# Patient Record
Sex: Female | Born: 1960 | Race: White | Hispanic: No | Marital: Married | Smoking: Former smoker
Health system: Southern US, Community
[De-identification: ages and names within clinical notes are randomized; demographics above are authoritative.]

## PROBLEM LIST (undated history)

## (undated) DIAGNOSIS — U071 COVID-19: Secondary | ICD-10-CM

## (undated) DIAGNOSIS — R4020 Unspecified coma: Secondary | ICD-10-CM

## (undated) HISTORY — DX: COVID-19: U07.1

## (undated) HISTORY — DX: Unspecified coma: R40.20

---

## 1999-09-17 ENCOUNTER — Other Ambulatory Visit: Admission: RE | Admit: 1999-09-17 | Discharge: 1999-09-17 | Payer: Self-pay | Admitting: Obstetrics and Gynecology

## 2001-06-11 ENCOUNTER — Other Ambulatory Visit: Admission: RE | Admit: 2001-06-11 | Discharge: 2001-06-11 | Payer: Self-pay | Admitting: Obstetrics and Gynecology

## 2001-08-13 ENCOUNTER — Other Ambulatory Visit: Admission: RE | Admit: 2001-08-13 | Discharge: 2001-08-13 | Payer: Self-pay | Admitting: Obstetrics and Gynecology

## 2003-12-27 ENCOUNTER — Emergency Department (HOSPITAL_COMMUNITY): Admission: EM | Admit: 2003-12-27 | Discharge: 2003-12-28 | Payer: Self-pay | Admitting: *Deleted

## 2003-12-29 ENCOUNTER — Inpatient Hospital Stay (HOSPITAL_COMMUNITY): Admission: EM | Admit: 2003-12-29 | Discharge: 2004-02-18 | Payer: Self-pay | Admitting: Emergency Medicine

## 2004-01-06 ENCOUNTER — Encounter (INDEPENDENT_AMBULATORY_CARE_PROVIDER_SITE_OTHER): Payer: Self-pay | Admitting: Specialist

## 2004-02-18 ENCOUNTER — Inpatient Hospital Stay (HOSPITAL_COMMUNITY): Admission: RE | Admit: 2004-02-18 | Discharge: 2004-02-24 | Payer: Self-pay | Admitting: Emergency Medicine

## 2004-03-04 ENCOUNTER — Encounter
Admission: RE | Admit: 2004-03-04 | Discharge: 2004-04-05 | Payer: Self-pay | Admitting: Physical Medicine & Rehabilitation

## 2004-03-09 ENCOUNTER — Ambulatory Visit (HOSPITAL_COMMUNITY)
Admission: RE | Admit: 2004-03-09 | Discharge: 2004-03-09 | Payer: Self-pay | Admitting: Physical Medicine & Rehabilitation

## 2004-03-31 ENCOUNTER — Encounter: Admission: RE | Admit: 2004-03-31 | Discharge: 2004-03-31 | Payer: Self-pay | Admitting: Thoracic Surgery

## 2004-04-02 ENCOUNTER — Encounter
Admission: RE | Admit: 2004-04-02 | Discharge: 2004-07-01 | Payer: Self-pay | Admitting: Physical Medicine & Rehabilitation

## 2004-05-25 ENCOUNTER — Ambulatory Visit: Payer: Self-pay | Admitting: Psychology

## 2004-06-08 ENCOUNTER — Ambulatory Visit: Payer: Self-pay | Admitting: Psychology

## 2004-06-29 ENCOUNTER — Encounter: Admission: RE | Admit: 2004-06-29 | Discharge: 2004-09-27 | Payer: Self-pay | Admitting: Psychology

## 2004-07-14 ENCOUNTER — Ambulatory Visit: Payer: Self-pay | Admitting: Psychology

## 2004-08-04 ENCOUNTER — Ambulatory Visit: Payer: Self-pay | Admitting: Psychology

## 2004-09-03 ENCOUNTER — Inpatient Hospital Stay (HOSPITAL_COMMUNITY): Admission: EM | Admit: 2004-09-03 | Discharge: 2004-09-09 | Payer: Self-pay | Admitting: *Deleted

## 2004-09-17 ENCOUNTER — Ambulatory Visit: Payer: Self-pay | Admitting: Psychology

## 2004-10-28 ENCOUNTER — Encounter: Admission: RE | Admit: 2004-10-28 | Discharge: 2005-01-26 | Payer: Self-pay

## 2004-12-18 ENCOUNTER — Ambulatory Visit: Payer: Self-pay | Admitting: Critical Care Medicine

## 2004-12-18 ENCOUNTER — Inpatient Hospital Stay (HOSPITAL_COMMUNITY): Admission: EM | Admit: 2004-12-18 | Discharge: 2004-12-20 | Payer: Self-pay | Admitting: Emergency Medicine

## 2004-12-23 ENCOUNTER — Ambulatory Visit: Payer: Self-pay | Admitting: Internal Medicine

## 2005-01-06 ENCOUNTER — Ambulatory Visit: Payer: Self-pay | Admitting: Internal Medicine

## 2005-02-13 IMAGING — CR DG CHEST 1V PORT
1 series · 2 of 2 positions shown · non-contrast
Comparison: none

CLINICAL DATA: Pneumonitis.
 CHEST PORTABLE, ONE VIEW 01/15/04 AT 3233 HOURS

[Series 1001: view not recorded · 0.20mm/px · 2 of 2 slices shown]
[im 1/2]
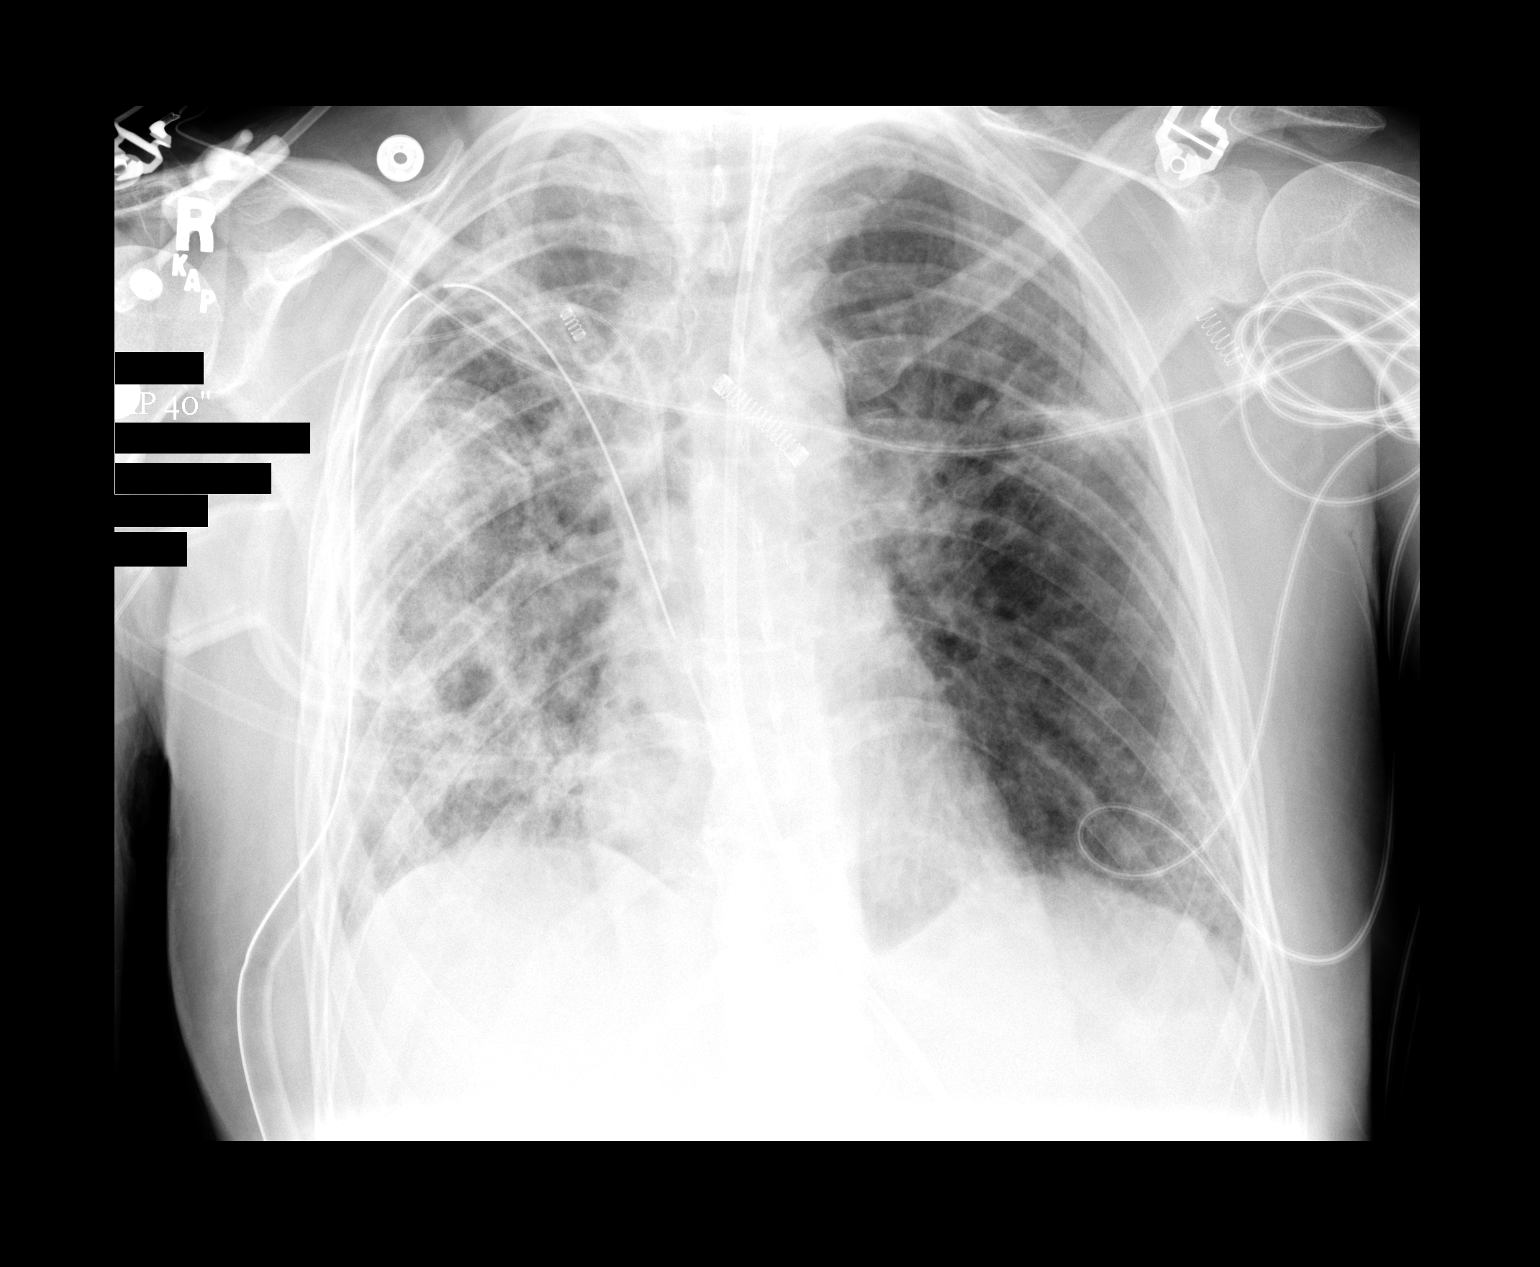
[im 2/2]
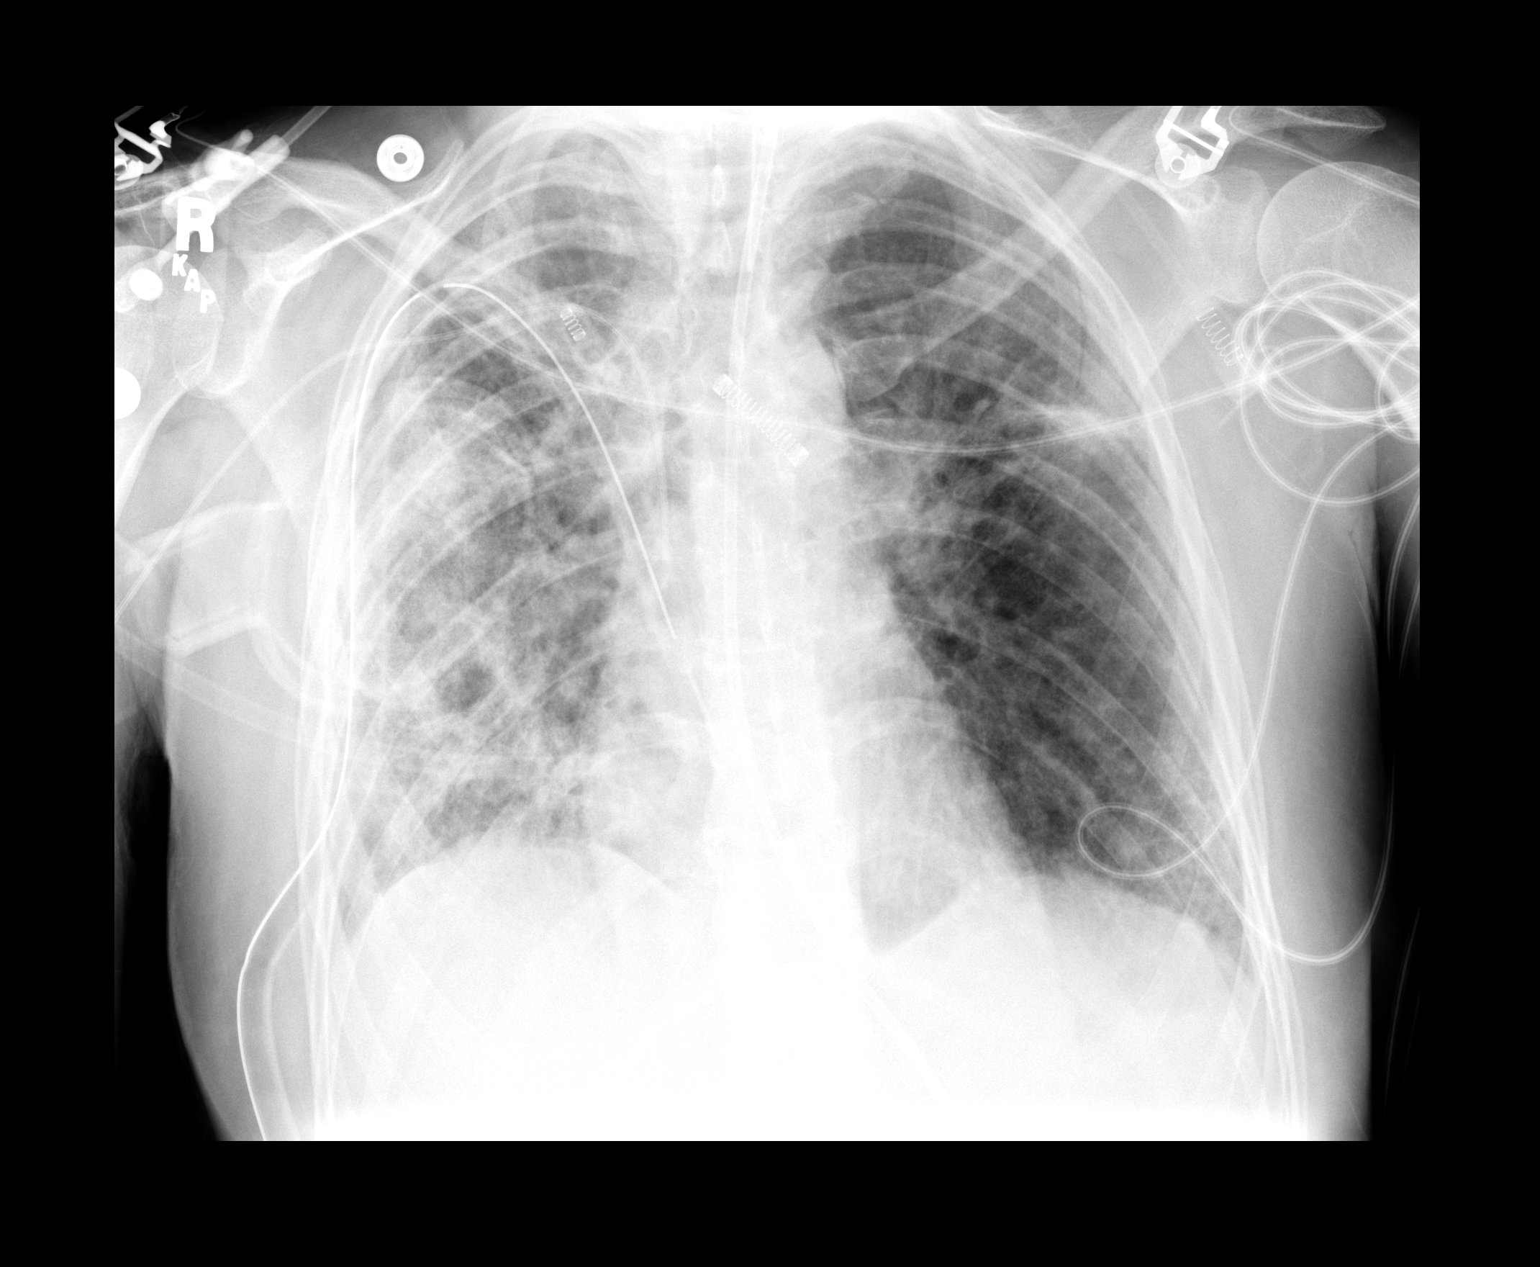

[2 of 2 positions shown; findings below may reference images not displayed]

FINDINGS: The endotracheal tube, feeding tube, and right chest tube are stable.  The right upper extremity PICC has been advanced to the SVC/right atrial junction.  Significant patchy bilateral airspace disease, right greater than left, is unchanged.  A tiny right apical pneumothorax is present.  No left pneumothorax is seen.  The heart remains normal in size. 
 IMPRESSION
 1.  Stable bilateral airspace disease.
 2.  Tiny right apical pneumothorax.

## 2005-02-18 IMAGING — CR DG CHEST 1V PORT
1 series · 1 of 1 positions shown · non-contrast
Comparison: 01/19/04.

CLINICAL DATA: followup airspace disease
 PORTABLE CHEST 01/20/04 [DATE] hours:

[view not recorded]
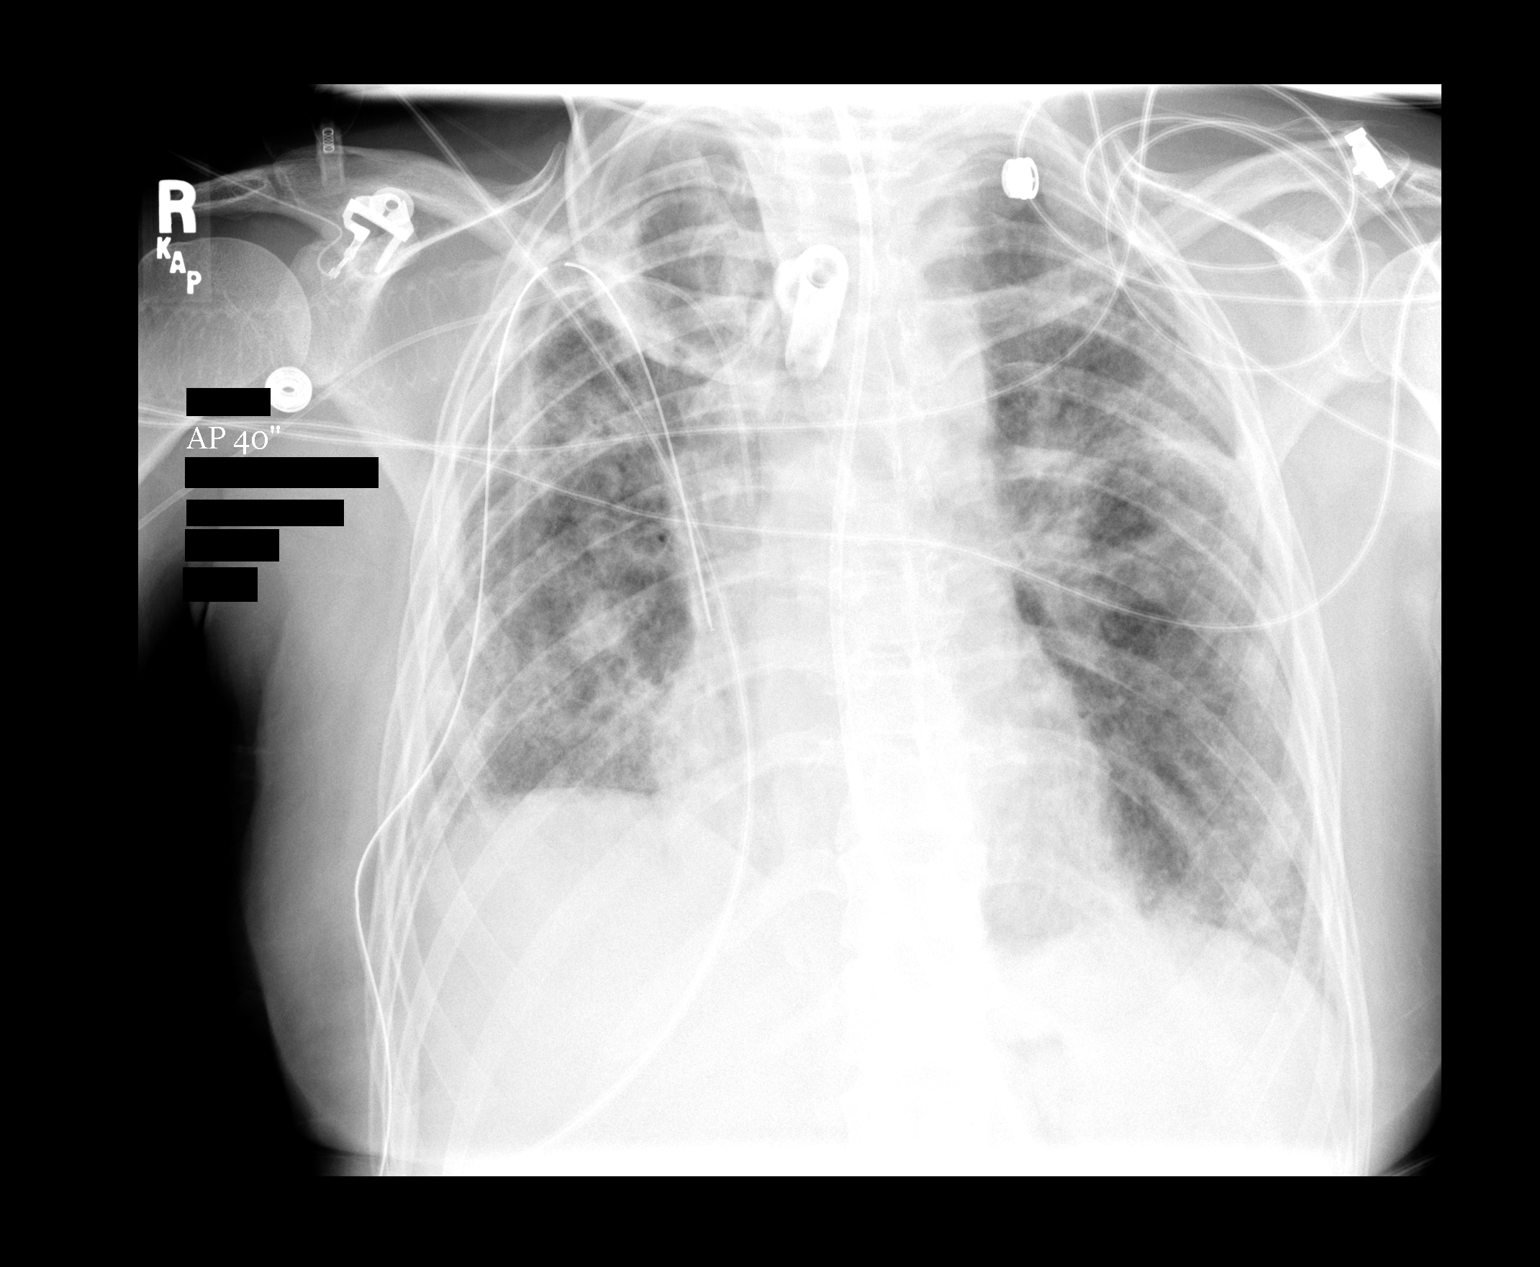

[1 of 1 positions shown; findings below may reference images not displayed]

Tracheostomy tube tip located 2cm above the carina.  There is a small right apical pneumothorax which is unchanged.  Bilateral airspace disease with mild worsening aeration left lower lobe.  No other significant change.  
 IMPRESSION
 Small right apical pneumothorax- stable.  Bilateral airspace disease with mild worsening aeration of the left lung base in the interval.

## 2005-02-19 IMAGING — CR DG CHEST 1V PORT
1 series · 1 of 1 positions shown · non-contrast
Comparison: none

CLINICAL DATA: Pneumonitis .
 PORTABLE CHEST RADIOGRAPH 
 Comparing 01/20/04.

[view not recorded]
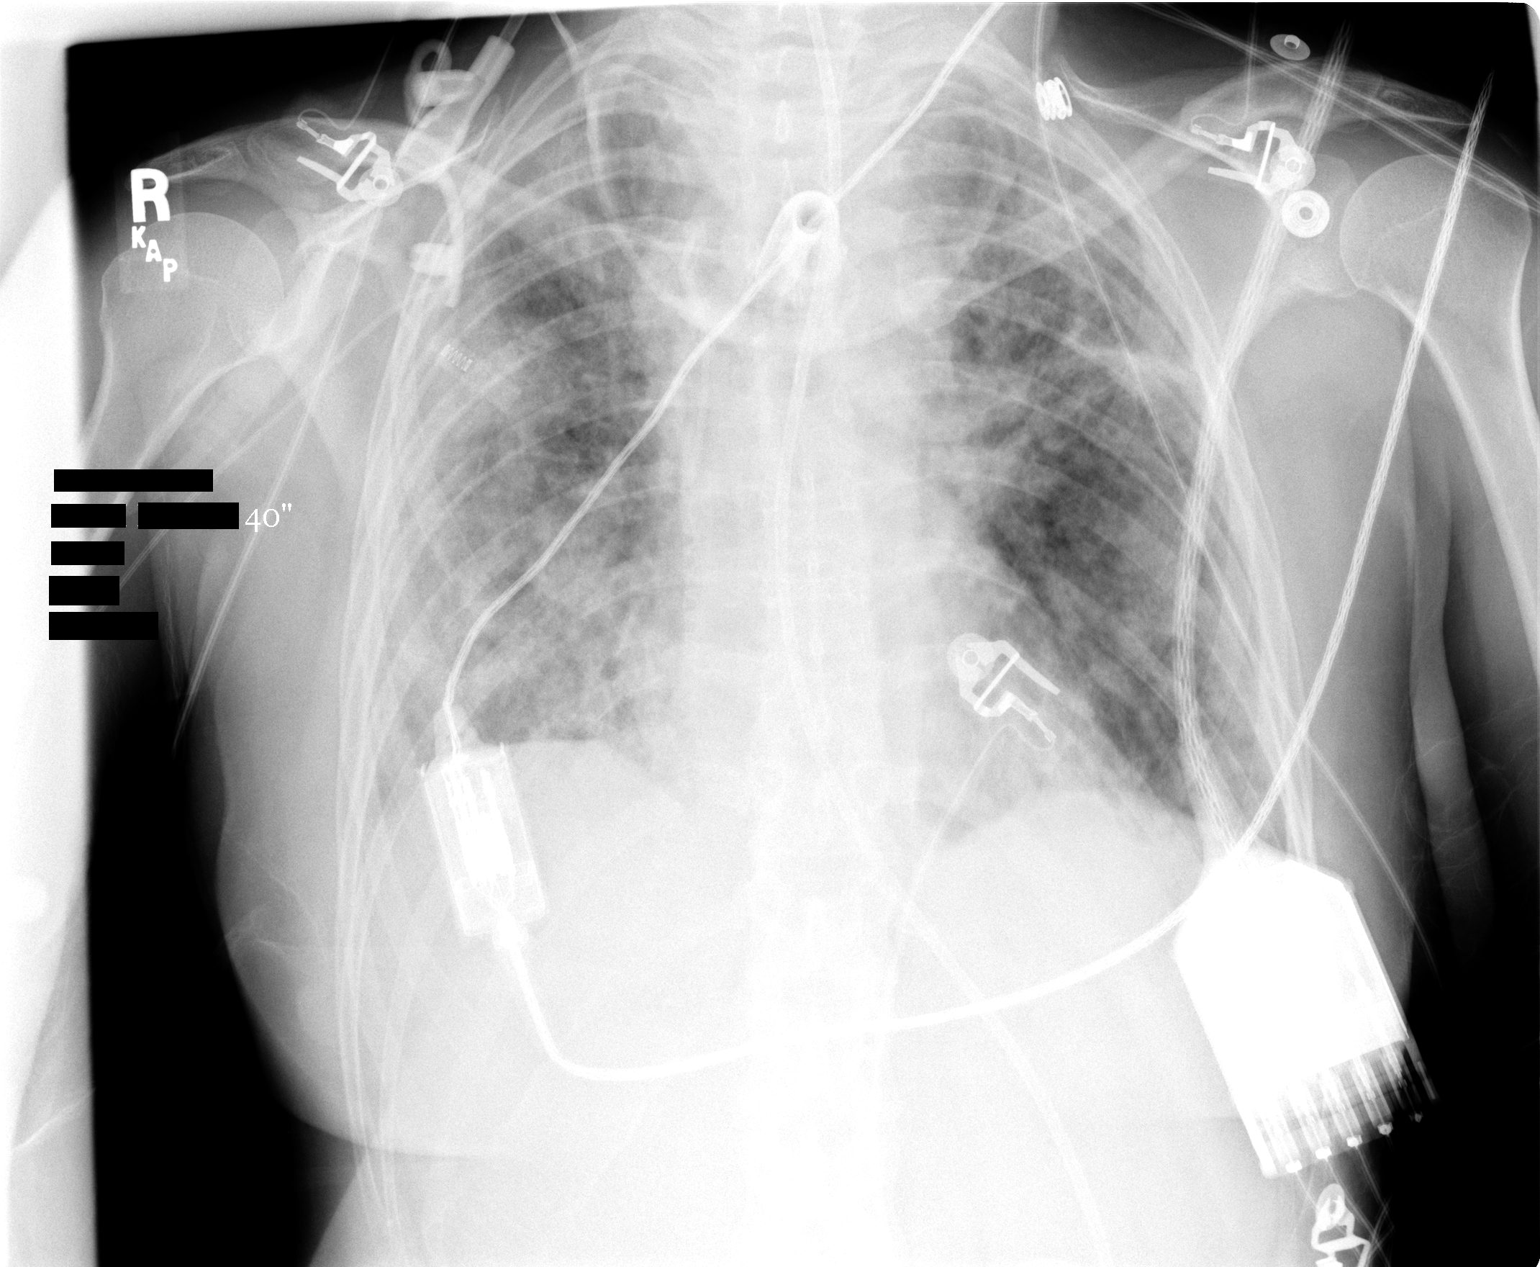

[1 of 1 positions shown; findings below may reference images not displayed]

FINDINGS: Bilateral patchy interstitial and air space opacities are again noted.  Support apparatus appears stable.  The patient has a known small right-sided paramediastinal pneumothorax which is likely still present although difficult to perceive on today?s exam.  
 IMPRESSION
 1.  Essentially stable pattern of patchy air space opacities.  The right pneumothorax is not as readily apparent but is likely still present.

## 2005-02-20 IMAGING — CR DG CHEST 1V PORT
1 series · 1 of 1 positions shown · non-contrast
Comparison: 01/21/04.

CLINICAL DATA: Follow-up airspace disease. 
 PORTABLE CHEST 01/22/04 TAKEN AT 6484 HOURS

[view not recorded]
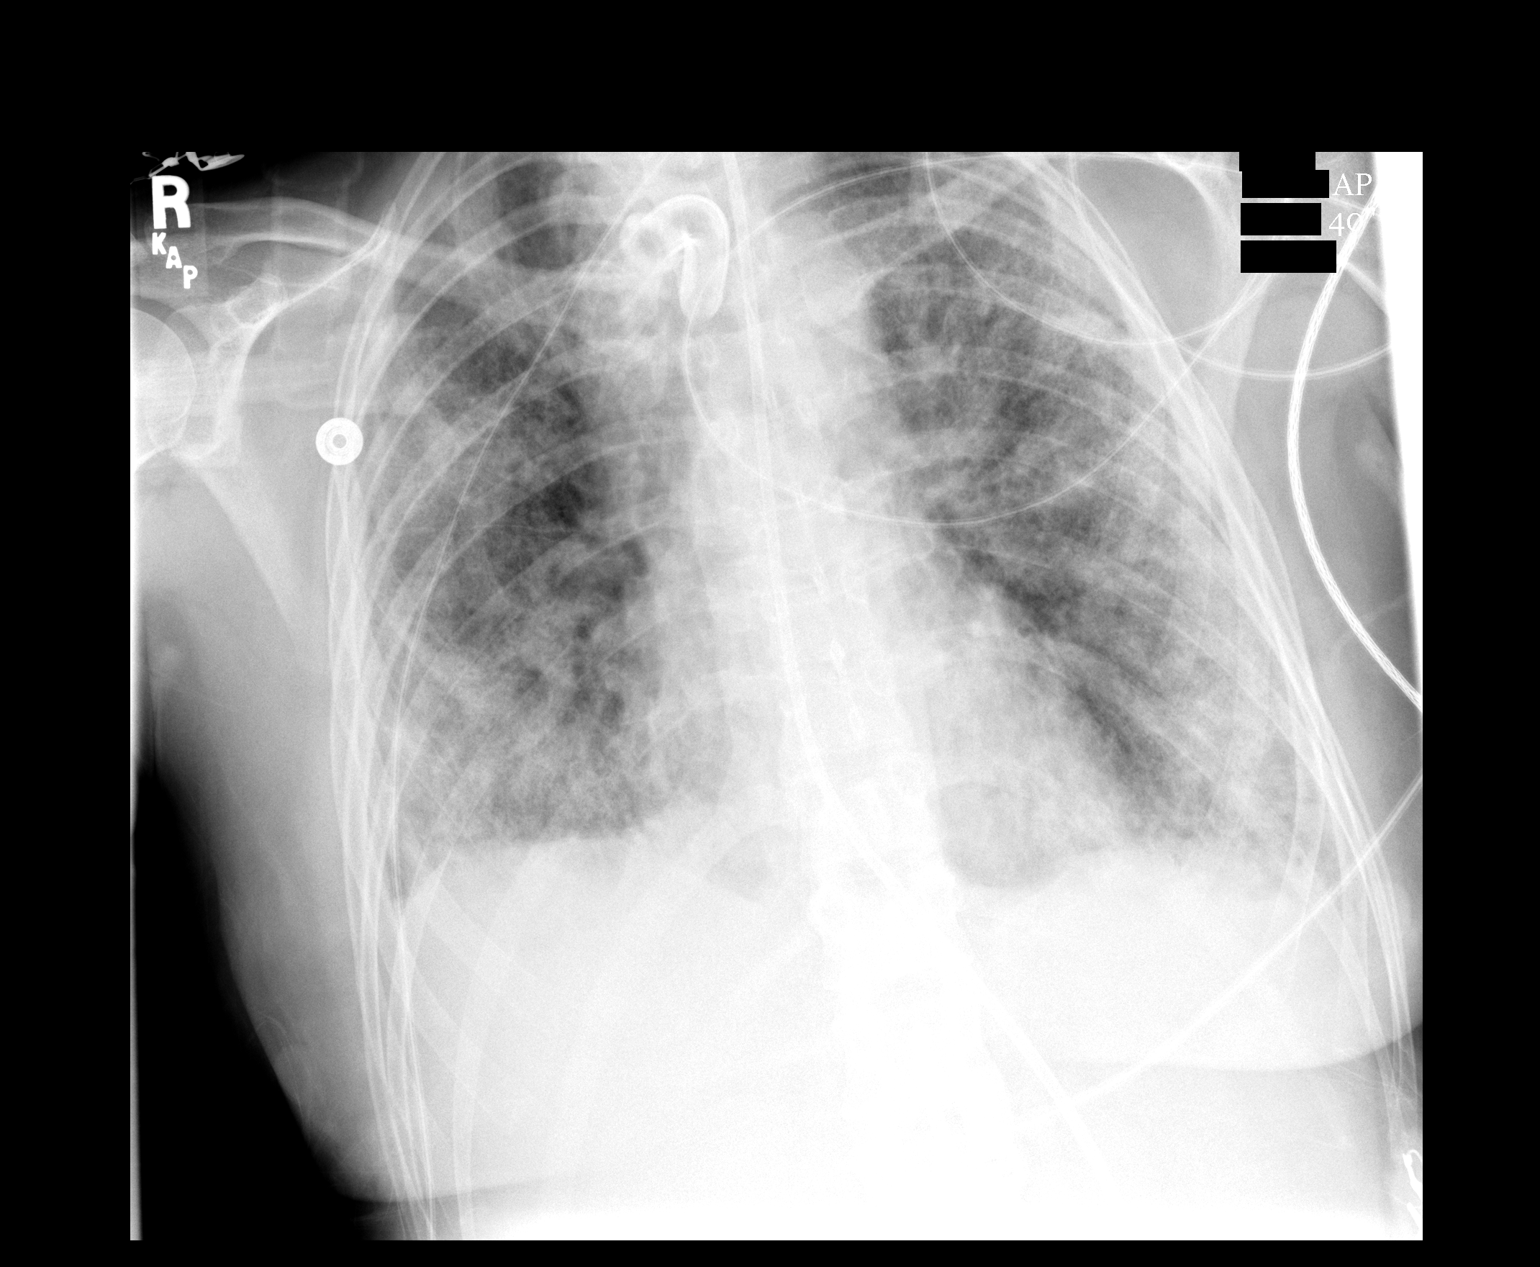

[1 of 1 positions shown; findings below may reference images not displayed]

There is diffuse bilateral airspace disease, which is stable.  There is no pneumothorax.  Tracheostomy tube appears in satisfactory position.  The right sided PICC line has been removed. 
 IMPRESSION
 No significant change in diffuse bilateral airspace disease.

## 2005-02-20 IMAGING — CR DG CHEST 1V PORT
1 series · 1 of 1 positions shown · non-contrast
Comparison: 01/22/04 earlier study.

CLINICAL DATA: Pneumonitis.  Central line placement.  
 PORTABLE CHEST 01/22/04 TAKEN AT 8855 HOURS

[view not recorded]
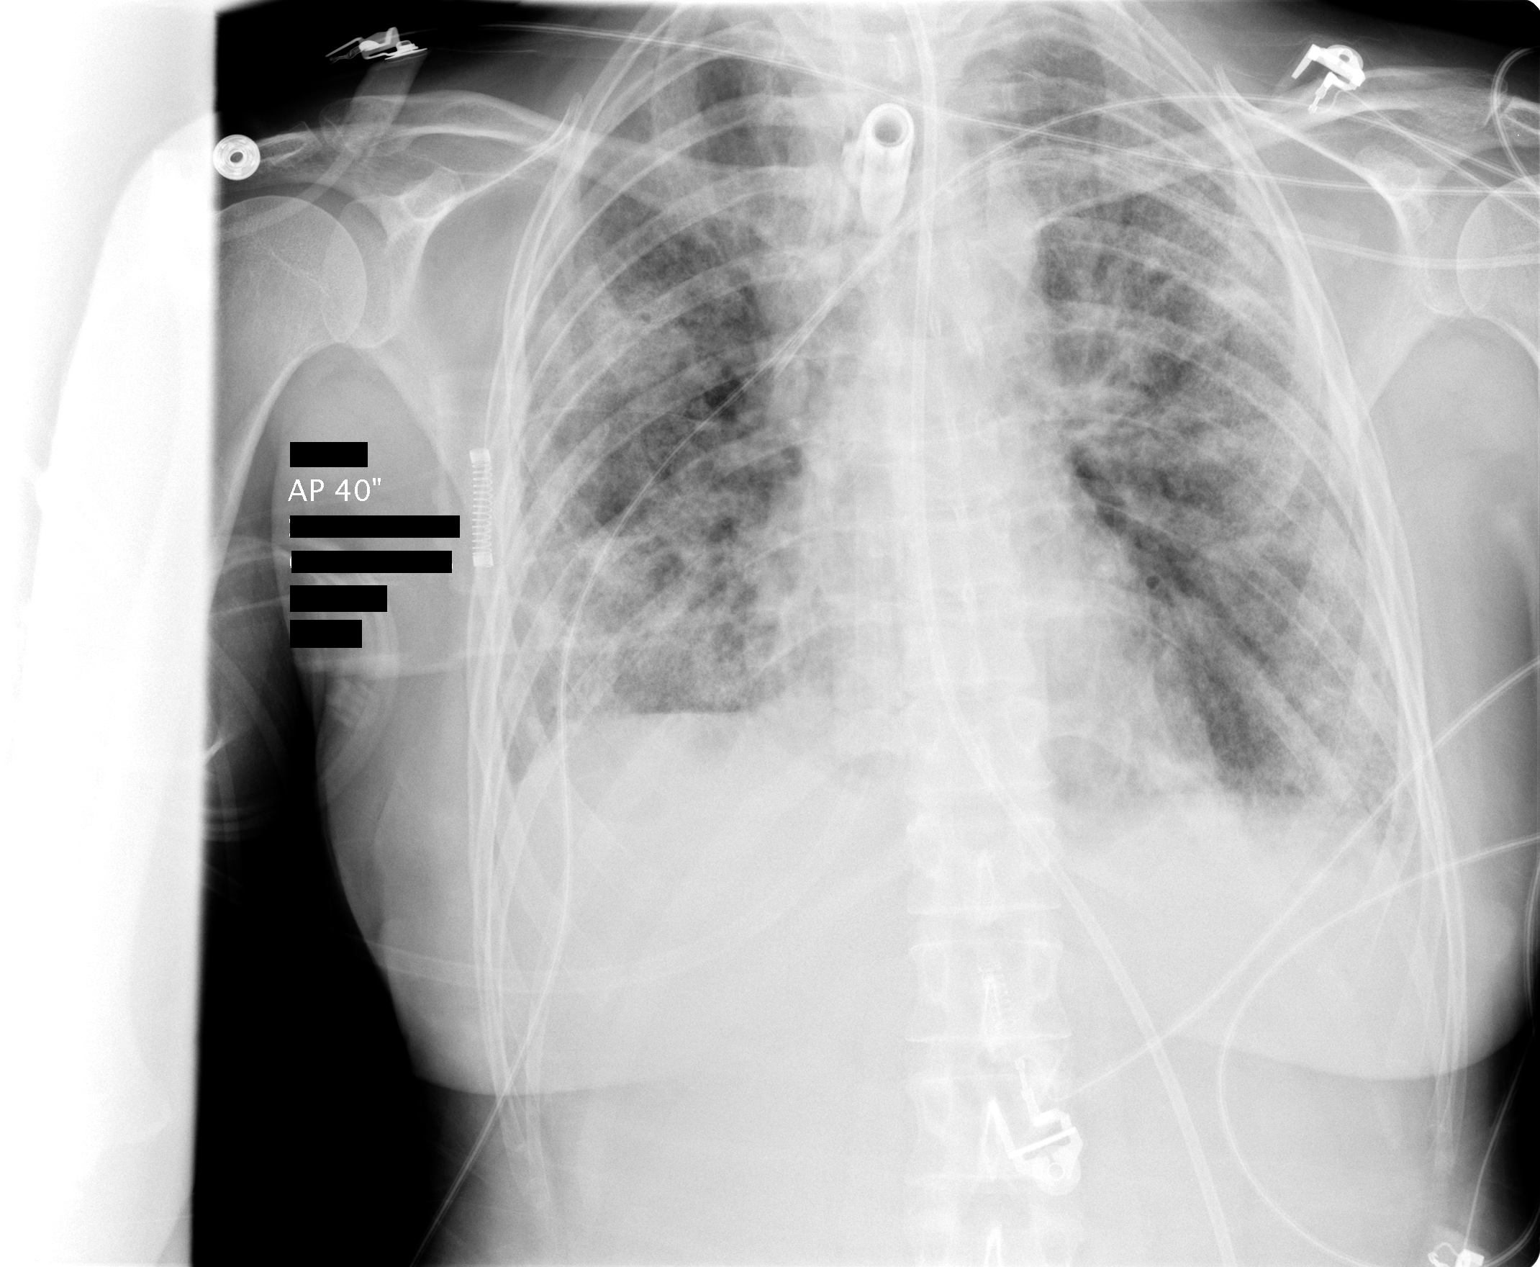

[1 of 1 positions shown; findings below may reference images not displayed]

Central venous catheter has been placed via a left subclavian route with the tip of the catheter in the region of the superior vena cava.  There is no pneumothorax.  There has been no significant change in the bilateral airspace disease. 
 IMPRESSION
 Placement of central venous catheter via a left subclavian route with the tip of the catheter in the region of the superior vena cava.  No pneumothorax.  No significant in bilateral airspace disease.

## 2005-10-04 ENCOUNTER — Encounter: Admission: RE | Admit: 2005-10-04 | Discharge: 2005-10-04 | Payer: Self-pay | Admitting: Obstetrics and Gynecology

## 2005-10-13 ENCOUNTER — Other Ambulatory Visit: Admission: RE | Admit: 2005-10-13 | Discharge: 2005-10-13 | Payer: Self-pay | Admitting: Obstetrics and Gynecology

## 2005-10-15 ENCOUNTER — Ambulatory Visit: Payer: Self-pay | Admitting: Internal Medicine

## 2005-10-15 ENCOUNTER — Inpatient Hospital Stay (HOSPITAL_COMMUNITY): Admission: EM | Admit: 2005-10-15 | Discharge: 2005-10-22 | Payer: Self-pay | Admitting: Emergency Medicine

## 2005-11-04 ENCOUNTER — Encounter: Admission: RE | Admit: 2005-11-04 | Discharge: 2006-02-02 | Payer: Self-pay | Admitting: Psychology

## 2005-11-04 ENCOUNTER — Ambulatory Visit: Payer: Self-pay | Admitting: Psychology

## 2005-11-09 ENCOUNTER — Other Ambulatory Visit (HOSPITAL_COMMUNITY): Admission: RE | Admit: 2005-11-09 | Discharge: 2005-11-29 | Payer: Self-pay | Admitting: Psychiatry

## 2005-11-09 ENCOUNTER — Ambulatory Visit: Payer: Self-pay | Admitting: Psychiatry

## 2005-12-08 ENCOUNTER — Ambulatory Visit: Payer: Self-pay | Admitting: Pulmonary Disease

## 2006-01-18 ENCOUNTER — Ambulatory Visit: Payer: Self-pay | Admitting: Psychology

## 2006-11-15 ENCOUNTER — Emergency Department (HOSPITAL_COMMUNITY): Admission: EM | Admit: 2006-11-15 | Discharge: 2006-11-16 | Payer: Self-pay | Admitting: Emergency Medicine

## 2007-06-14 ENCOUNTER — Emergency Department (HOSPITAL_COMMUNITY): Admission: EM | Admit: 2007-06-14 | Discharge: 2007-06-14 | Payer: Self-pay | Admitting: Emergency Medicine

## 2007-09-26 ENCOUNTER — Ambulatory Visit: Payer: Self-pay | Admitting: Psychology

## 2007-09-26 ENCOUNTER — Encounter: Admission: RE | Admit: 2007-09-26 | Discharge: 2007-12-25 | Payer: Self-pay | Admitting: Family Medicine

## 2007-11-15 ENCOUNTER — Ambulatory Visit: Payer: Self-pay | Admitting: Psychology

## 2009-09-06 ENCOUNTER — Encounter: Payer: Self-pay | Admitting: Internal Medicine

## 2009-09-10 ENCOUNTER — Encounter: Payer: Self-pay | Admitting: Internal Medicine

## 2009-09-14 ENCOUNTER — Encounter: Payer: Self-pay | Admitting: Internal Medicine

## 2009-09-15 ENCOUNTER — Encounter: Payer: Self-pay | Admitting: Internal Medicine

## 2009-09-16 ENCOUNTER — Ambulatory Visit: Payer: Self-pay | Admitting: Internal Medicine

## 2009-09-16 DIAGNOSIS — R05 Cough: Secondary | ICD-10-CM

## 2009-09-16 DIAGNOSIS — J449 Chronic obstructive pulmonary disease, unspecified: Secondary | ICD-10-CM

## 2009-09-16 DIAGNOSIS — J209 Acute bronchitis, unspecified: Secondary | ICD-10-CM | POA: Insufficient documentation

## 2009-09-16 DIAGNOSIS — R059 Cough, unspecified: Secondary | ICD-10-CM | POA: Insufficient documentation

## 2009-09-16 DIAGNOSIS — J984 Other disorders of lung: Secondary | ICD-10-CM | POA: Insufficient documentation

## 2009-10-19 ENCOUNTER — Ambulatory Visit: Payer: Self-pay | Admitting: Internal Medicine

## 2009-10-19 DIAGNOSIS — F17200 Nicotine dependence, unspecified, uncomplicated: Secondary | ICD-10-CM | POA: Insufficient documentation

## 2009-10-22 ENCOUNTER — Telehealth (INDEPENDENT_AMBULATORY_CARE_PROVIDER_SITE_OTHER): Payer: Self-pay | Admitting: *Deleted

## 2010-06-29 ENCOUNTER — Telehealth (INDEPENDENT_AMBULATORY_CARE_PROVIDER_SITE_OTHER): Payer: Self-pay | Admitting: *Deleted

## 2010-09-19 ENCOUNTER — Encounter: Payer: Self-pay | Admitting: Thoracic Surgery

## 2010-09-19 ENCOUNTER — Encounter: Payer: Self-pay | Admitting: Physical Medicine & Rehabilitation

## 2010-09-28 NOTE — Letter (Signed)
Summary: [facility] [employer]   Imported By: Lennie Odor 09/22/2009 14:47:50  _____________________________________________________________________  External Attachment:    Type:   Image     Comment:   External Document

## 2010-09-28 NOTE — Progress Notes (Signed)
  Phone Note Other Incoming   Request: Send information Summary of Call: Request for records received from [facility]. Request forwarded to [facility].     

## 2010-09-28 NOTE — Miscellaneous (Signed)
Summary: Orders Update pft charges  Clinical Lists Changes  Orders: Added new Service order of Carbon Monoxide diffusing w/capacity (94720) - Signed Added new Service order of Lung Volumes (94240) - Signed Added new Service order of Spirometry (Pre & Post) (94060) - Signed 

## 2010-09-28 NOTE — Letter (Signed)
Summary: [facility] [employer]   Imported By: Lennie Odor 09/22/2009 14:34:16  _____________________________________________________________________  External Attachment:    Type:   Image     Comment:   External Document

## 2010-09-28 NOTE — Progress Notes (Signed)
Summary: refill tramadol  Phone Note Call from Patient   Caller: Spouse Call For: wert Summary of Call: per spouse: pt coughing agreat deal and requests "the pills she had before that helped control her cough". (pt was seen here 2/21). spouse clay cell # [phone] Initial call taken by: Tivis Ringer, CNA,  October 22, 2009 3:27 PM  Follow-up for Phone Call        called and spoke with pts husband clay---he stated that this all started this am---dry cough---denies fever but does have body aches.  she is currently on amoxicillian for sinus infection and ear infection--she is requesting to have tramadol called in for the severe cough.  please advise.  thanks Randell Loop CMA  October 22, 2009 3:42 PM  ok x # 40 no refills Follow-up by: Nyoka Cowden MD,  October 22, 2009 5:21 PM  Additional Follow-up for Phone Call Additional follow up Details #1::        Spoke with pt's spouse and made aware rx being called in.   Additional Follow-up by: Vernie Murders,  October 22, 2009 5:30 PM    New/Updated Medications: TRAMADOL HCL 50 MG TABS (TRAMADOL HCL) 1 to 2 every 4-6 hours as needed Prescriptions: TRAMADOL HCL 50 MG TABS (TRAMADOL HCL) 1 to 2 every 4-6 hours as needed  #40 x 0   Entered by:   Vernie Murders   Authorized by:   Nyoka Cowden MD   Signed by:   Vernie Murders on 10/22/2009   Method used:   Electronically to        [facility] [phone]* (retail)       [address]       [place], [place]  [address]       Ph: [phone] or [phone]       Fax: [phone]   RxID:   [id number]

## 2010-09-28 NOTE — Assessment & Plan Note (Signed)
Summary: Pulmonary/ ext summary f/u ov   Copy to:  [name], FNP Primary Provider/Referring Provider:  none  CC:  1 month followup.  Pt states that cough and breathing are much better.  No complaints today.Ashley [name]  History of Present Illness: 61 yowf smoker 2005 ARDS recovered except not able to do aerobics due to doe chronically.   September 16, 2009 cc worse x 2week  sob  chest tightness and hoarse and cough > clear mucus worse day than night assoc with nasal congestion and sore throat .  not much better after rx with levaquin/sterapred and atrovent 1/13.  rec stop smoking take omeprazole and pepcid at bedtime  October 19, 2009 still smoking but  cough and breathing are much better.  No complaints today.  Pt denies any significant sore throat, dysphagia, itching, sneezing,  nasal congestion or excess secretions,  fever, chills, sweats, unintended wt loss, pleuritic or exertional cp, hempoptysis, change in activity tolerance  orthopnea pnd or leg swelling Pt denies any significant sore throat, dysphagia, itching, sneezing,  nasal congestion or excess secretions,  fever, chills, sweats, unintended wt loss, pleuritic or exertional cp, hempoptysis, change in activity tolerance  orthopnea pnd or leg swelling     Current Medications (verified): 1)  Atrovent Hfa 17 Mcg/act Aers (Ipratropium Bromide Hfa) .... 2 Puffs Once Daily 2)  Alprazolam 0.5 Mg Tabs (Alprazolam) .Ashley [name].. 1 Three Times A Day As Needed 3)  Tramadol Hcl 50 Mg  Tabs (Tramadol Hcl) .... One To Two By Mouth Every 4-6 Hours As Needed For Cough 4)  Cymbalta 60 Mg Cpep (Duloxetine Hcl) .Ashley [name].. 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  Oropharynx no thrush or excess pnd or cobblestoning.  No JVD or cervical adenopathy. Mild accessory muscle hypertrophy. Trachea midline, nl thryroid. Chest was hyperinflated by percussion with diminished breath sounds and moderate increased exp time without wheeze. Hoover sign positive at mid inspiration. Regular rate and rhythm without murmur gallop or rub or increase P2 or edema.  Abd: no hsm, nl excursion. Ext warm without cyanosis or clubbing.     CXR  Procedure date:  10/19/2009  Findings:      Chronic diffuse reticulonodular interstitial lung disease, less severe than on examinations in 2008 and 2006.  No new/acute cardiopulmonary disease.    Impression & Recommendations:  Problem # 1:  CHRONIC OBSTRUCTIVE PULMONARY DISEASE (ICD-496) Excluded by PFT's today   I took this opportunity to educate the patient regarding the consequences of smoking in airway disorders based on all the data we have from the multiple national lung health studies indicating that smoking  cessation, not choice of inhalers or physicians, is the most important aspect of care.   therefore focus should be on stopping smoking  Problem # 2:  COUGH (ICD-786.2) Upper airway cough syndrome, so named because it's frequently impossible to sort out how much is LPR vs CR/sinusitis with freq throat clearing generating secondary extra esophageal GERD from wide swings in gastric pressure that occur with throat clearing, promoting self use of mint and menthol lozenges that reduce the lower esophageal sphincter tone and exacerbate the problem  further.  These symptoms are easily confused with asthma/copd by even experienced pulmonogists because they overlap so much. These are the same pts who not infrequently have failed to tolerate ace inhibitors,  dry powder inhalers or biphosphonates or report having reflux symptoms that don't respond to standard doses of PPI   For now continue high dose ppi until better then one ppi daily.  Problem # 3:  SMOKER (ICD-305.1)  Discussed but not ready to committ to quit at this point - emphasized risks involved in continuing smoking and that patient should consider these in the context of the cost of smoking relative to the benefit obtained. Husband also smokes so this will be a challenge for them.  Orders: Est. Patient Level IV (13086)  Medications Added to Medication List This Visit: 1)  Atrovent Hfa 17 Mcg/act Aers (Ipratropium bromide hfa) .... 2 puffs once daily 2)  Cymbalta 60 Mg Cpep (Duloxetine hcl) .Ashley [name].. 1 once daily 3)  Prilosec Otc 20 Mg Tbec (Omeprazole magnesium) .... Take one 30-60 min before first and last meals of the day until voice better then once a day 4)  Pepcid Ac Maximum Strength 20 Mg Tabs (Famotidine) .... One at bedtime  Other Orders: T-2 View CXR (71020TC)  Patient Instructions: 1)  Your prognosis is excellent as long as you take care of your lungs so try do so 2)  Stopping smoking will do more for you that I can do 3)  Prilosec 20 mg Take  one 30-60 min before first meal of the day and add pepcid at bedtime as long as you are hoarse and once this is better ok ot stop second dose of prilosec 4)  GERD (REFLUX)  is a common cause of respiratory symptoms. It commonly presents without heartburn and can be treated with medication, but also with lifestyle changes including avoidance of late meals, excessive alcohol, smoking cessation, and avoid fatty foods, chocolate, peppermint, colas, red wine, and acidic juices such as orange juice. NO MINT OR MENTHOL PRODUCTS SO NO COUGH  DROPS  5)  USE SUGARLESS CANDY INSTEAD (jolley ranchers)  6)  NO OIL BASED VITAMINS  7)  Return here if increased short of breath or cough

## 2010-09-28 NOTE — Assessment & Plan Note (Signed)
Summary: Pulmonary/ new pt eval for cough   Visit Type:  Initial Consult Copy to:  [name], FNP Primary Provider/Referring Provider:  none  CC:  COPD.  History of Present Illness: 7 yowf smoker 2005 ARDS recovered except not able to do aerobics due to doe chronically.   September 16, 2009 cc worse x 2week  sob  chest tightness and hoarse and cough > clear mucus worse day than night assoc with nasal congestion and sore throat .  not much better after rx with levaquin/sterapred and atrovent 1/13     Pt denies any significant  dysphagia, itching, sneezing,    fever, chills, sweats, unintended wt loss, pleuritic or exertional cp, hempoptysis, change in activity tolerance  orthopnea pnd or leg swelling Pt also denies any obvious fluctuation in symptoms with weather or environmental change or other alleviating or aggravating factors.       Current Medications (verified): 1)  Atrovent Hfa 17 Mcg/act Aers (Ipratropium Bromide Hfa) .... 2 Puffs Four Times A Day 2)  Alprazolam 0.5 Mg Tabs (Alprazolam) .Marland Kitchen.. 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  Smokes 1 ppd.  Review of Systems       The patient complains of shortness of breath with activity, productive cough, non-productive cough, sore throat, headaches, nasal congestion/difficulty breathing through nose, anxiety, depression, and hand/feet swelling.  The patient denies shortness of breath at rest, coughing up blood, chest pain, irregular heartbeats, acid heartburn, indigestion, loss of appetite, weight change, abdominal pain, difficulty swallowing,  tooth/dental problems, sneezing, itching, ear ache, joint stiffness or pain, rash, change in color of mucus, and fever.    Vital Signs:  Patient profile:   50 year old female Height:      62 inches Weight:      151 pounds BMI:     27.72 O2 Sat:      98 % on Room air Temp:     98.7 degrees F oral Pulse rate:   109 / minute BP sitting:   110 / 86  (left arm)  Vitals Entered By: Vernie Murders (September 16, 2009 2:07 PM)  O2 Flow:  Room air  Physical Exam  Additional Exam:  amb animated very hoarse wf nad wt 151 September 16, 2009  HEENT mild turbinate edema.  Oropharynx no thrush or excess pnd or cobblestoning.  No JVD or cervical adenopathy. Mild accessory muscle hypertrophy. Trachea midline, nl thryroid. Chest was hyperinflated by percussion with diminished breath sounds and moderate increased exp time without wheeze. Hoover sign positive at mid inspiration. Regular rate and rhythm without murmur gallop or rub or increase P2 or edema.  Abd: no hsm, nl excursion. Ext warm without cyanosis or clubbing.     CXR  Procedure date:  09/07/2009  Findings:      wnl  Impression & Recommendations:  Problem # 1:  COUGH (ICD-786.2)    DDX of  difficult airways managment all start with A and  include Adherence, Ace Inhibitors, Acid Reflux, Active Sinus Disease, Alpha 1 Antitripsin deficiency, Anxiety masquerading as Airways dz,  ABPA,  allergy(esp in young), Aspiration (esp in elderly), Adverse effects  of DPI,  Active smokers, plus one B  = Beta blocker use..    In this case actively smoking, may have active sinus dz or reflux in the ddx  Of the three most common causes of chronic cough, only one (GERD)can actually trigger the other two (sinus and asthma) and perpetuate the cylce of cough inducing airway trauma, inflammation, heightened sensitivity to reflux which is prompted by the cough itself via a cyclical mechanism.  This may partially respond to steroids and look like asthma and post  nasal drainage but never erradicated completely unless the cough and the secondary reflux are eliminated, preferably both at the same time.   The standardized cough guidelines recently published in Chest are a 14 step process, not a single office visit,  and are intended  to address this problem logically,  with an alogrithm dependent on response to each progressive step  to determine a specific diagnosis with  minimal addtional testing needed. Therefore if compliance is an issue this empiric standardized approach simply won't work.   rec trial of max gerd rx and careful f/u.  See instructions for specific recommendations   Orders: Consultation Level V [id number])  Problem # 2:  CHRONIC OBSTRUCTIVE PULMONARY DISEASE (ICD-496)  I took this opportunity to educate the patient regarding the consequences of smoking in airway disorders based on all the data we have from the multiple national lung health studies indicating that smoking cessation, not choice of inhalers or physicians, is the most important aspect of care.    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 2 puffs four times a day 2)  Alprazolam 0.5 Mg Tabs (Alprazolam) .Marland Kitchen.. 1 three times a day as needed 3)  Tramadol Hcl 50 Mg Tabs (Tramadol hcl) .... One to two by mouth every 4-6 hours as needed for cough  Patient Instructions: 1)  Acid reflux is a  leading suspect here and needs to be eliminated  completely before considering additional studies or treatment options. To suppress this maximally, take prilosec before first and last meal and pepcid 20 mg (otc) at bedtime plus diet measures as listed.  2)  GERD (REFLUX)  is a  common cause of respiratory symptoms. It commonly presents without heartburn and can be treated with medication, but also with lifestyle changes including avoidance of late meals, excessive alcohol, smoking cessation, and avoid fatty foods, chocolate, peppermint, colas, red wine, and acidic juices such as orange juice. NO MINT OR MENTHOL PRODUCTS SO NO COUGH DROPS  3)  USE SUGARLESS CANDY INSTEAD (jolley ranchers)  4)  NO OIL BASED VITAMINS  5)  finish levquin and prednisone 6)  Stop smoking before it stops you 7)  Please schedule a follow-up appointment in 4 weeks, sooner if needed  with PFT's on return 8)  Take delsym two tsp every 12 hours and add tramadol 50 mg up to 2 every 4 hours to suppress the urge to cough. Swallowing water or using ice chips/non mint and menthol containing candies (such as lifesavers or sugarless jolly ranchers) are also effective.  Prescriptions: TRAMADOL HCL 50 MG  TABS (TRAMADOL HCL) One to two by mouth every 4-6 hours as needed for  cough  #40 x 0   Entered and Authorized by:   Nyoka Cowden MD   Signed by:   Nyoka Cowden MD on 09/16/2009   Method used:   Electronically to        [facility]* (retail)       [address] [place], [place]  [address]       Ph: [phone]       Fax: [phone]   RxID:   [id number]

## 2010-09-28 NOTE — Letter (Signed)
Summary: [facility]phone [facility]  Telephone Encounter  [facility] [employer]   Imported By: Lennie Odor 09/22/2009 14:43:19  _____________________________________________________________________  External Attachment:    Type:   Image     Comment:   External Document

## 2011-01-14 NOTE — Op Note (Signed)
NAMEKENDLE, TURBIN                            ACCOUNT NO.:  [id number]   MEDICAL RECORD NO.:  [id number]                   PATIENT TYPE:  INP   LOCATION:  2301                                 FACILITY:  [facility]   PHYSICIAN:  Ines Bloomer, M.D.              DATE OF BIRTH:  1960/10/03   DATE OF PROCEDURE:  DATE OF DISCHARGE:                                 OPERATIVE REPORT   PREOPERATIVE DIAGNOSES:  Respiratory failure, bilateral pulmonary  infiltrates.   POSTOPERATIVE DIAGNOSES:  Respiratory failure, bilateral pulmonary  infiltrates.   OPERATION/PROCEDURE:  Right VAT, insertion of right subclavian catheter and  lung biopsy x3.   SURGEON:  Ines Bloomer, M.D.   FIRST ASSISTANT:  Ms. Gwenlyn Fudge, P.A.-C.   ANESTHESIA:  General.   DESCRIPTION OF PROCEDURE:  The patient was brought over on the ventilator to  the operating room and underwent a right subclavian puncture was performed,  after prepping and draping the right shoulder and through the, over the  needle, a guide wire was threaded to the right atrium.  Over the guide wire  was passed a dilator and then the catheter and the guide wire was removed  and the catheter was sutured in place with 2-0 silk and withdrew blood  easily.  The patient was then turned to the right lateral thoracotomy  position, was prepped and draped in the usual sterile manner.  Two trocar  sites were made in the anterior and posterior ends of the seventh  intercostal space.  The right lung, a bronchial blocker had been placed down  through the endotracheal tube into the right lung.  After this had been  done, the third trocar site was made in the fifth intercostal space at the  mid axillary line.  Three trocars were inserted.  The 30 degree scope was  inserted and you could tell that there was marked inflammation of the lung.  Pictures were taken of this and then the Keiser ringed forceps was brought  through the mid trocar site and then  resected and using the easy 45 stapler  and the SEB45 stapler, the superior portion of the superior segment was  stable with three applications.  Part of this was sent for cultures and the  other part was sent for frozen section.  Then while frozen section was being  done, the posterior segment of the right upper lobe was grasped with the  Keiser ringed forceps and again using the easy 45 stapler and the SEB45  stapler, this was resected with three applications and sent for permanent  section.  Finally, the right middle lobe was grasped with the Keiser ringed  forceps coming through the medial incision and then using the stapler from  the lateral incision and the scope in the middle incision, the SEB45 stapler  and the easy 45 stapler were used to resect another piece of middle lobe.  This was sent for permanent section.  Two chest tubes were put in through  the lower trocar sites, tied in place with 0 silk.  The upper trocar site  was closed with 3-0 Vicryl and the subcutaneous tissue and then the skin.  The patient was hooked to a chest tube.  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  The patient was returned to the intensive care unit in stable but serious  condition.                                              Ines Bloomer, M.D.   DPB/MEDQ  D:  01/06/2004  T:  01/07/2004  Job:  [id number]   cc:   Charlaine Dalton. Sherene Sires, M.D. [facility]

## 2011-01-14 NOTE — Discharge Summary (Signed)
NAMESWAYZEE, WADLEY                  ACCOUNT NO.:  [id number]   MEDICAL RECORD NO.:  [id number]          PATIENT TYPE:  INP   LOCATION:  3016                         FACILITY:  [facility]   PHYSICIAN:  Casimiro Needle B. Sherene Sires, M.D. [facility] OF BIRTH:  [birth year]   DATE OF ADMISSION:  12/17/2004  DATE OF DISCHARGE:  12/20/2004                                 DISCHARGE SUMMARY   FINAL DIAGNOSES:  1.  Acute exacerbation of chronic obstructive pulmonary disease with      purulent tracheobronchitis.      1.  History of bronchiectasis, status post previous mechanical          ventilator for adult respiratory distress syndrome.      2.  Actively smoking.  2.  Anxiety and depression.   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  She came to the emergency  room with an acute exacerbation of cough and dyspnea with purulent sputum  reported over several days with a saturation of only 85% on room air and was  admitted.  Chest x-ray was felt to show no acute change from baseline.  Because of the desaturation and increased markings, she was treated as  pneumonia because we could not rule it out.  Her initial laboratory studies  were remarkable for a white count of 17,000 and a hematocrit of 32%.   Blood cultures were negative.  We were not able to obtain sputum for  sampling.  However, her sputum turned clear after two days of Rocephin and  Zithromax and her lungs were perfectly clear on exam with saturations  adequate on room air.   Therefore, the patient is discharged in improved condition and will follow  up in the office in one week with all of her medicines and the list she was  given today, which includes the continue use of Xanax 1 mg tablet three  times daily, Lexapro 30 mg  daily (this was the dose she said she was on at  admission), Seroquel 75 mg q.h.s., prednisone 40 mg per day to be tapered  off over an eight-day period, Avelox 400 mg empirically for five days and  albuterol two puffs q.4h. p.r.n.  For cough, she can take Mucinex DM one to  two q.12h.   She has been advised if she cannot comply with this regimen and stop  smoking, there is nothing that specialist can do for her and that we would  not continue to follow her in the pulmonary clinic in this setting because  it would not be of any benefit to her without her commitment to maintaining  smoking cessation and take her medicines as they are prescribed.  Resume  normal activity and diet.      MBW/MEDQ  D:  12/20/2004  T:  12/20/2004  Job:  [id number]   cc:   Chales Salmon. Abigail Miyamoto, M.D.  [address]  [address]  [place]  [place] [address]  Fax: [phone]

## 2011-01-14 NOTE — H&P (Signed)
Ashley [name], Ashley [name]                  ACCOUNT NO.:  [id number]   MEDICAL RECORD NO.:  [id number]          PATIENT TYPE:  INP   LOCATION:  3016                         FACILITY:  [facility]   PHYSICIAN:  Shan Levans, M.D. LHCDATE OF BIRTH:  [birth year]   DATE OF ADMISSION:  12/17/2004  DATE OF DISCHARGE:                                HISTORY & PHYSICAL   CHIEF COMPLAINT:  Cough and dyspnea.   HISTORY OF PRESENT ILLNESS:  This is a 50 year old white female.  This is  one of multiple admissions for pulmonary process.  Last admission was in  January 2006, when she was admitted to the [facility] for  acute tachypnea, shortness of breath, and cough, found to have acute  bronchitis and community-acquired pneumonia, history of chronic anxiety  disorder, previous history of severe ARDS and pneumonia with  pneumomediastinum requiring prolonged vent, tracheotomy tube, and prolonged  rehab in May 2005.  Continues to smoke despite this with underlying anxiety.  Also history of pulmonary embolism with a May 2005 admission with prolonged  course of Coumadin therapy, now off.  The patient notes a several day  history of increasing cough, shortness of breath, chest congestion, still  smoking a pack of cigarettes daily.  Does not drink alcohol.  History of  chronic anxiety as noted.  The patient presents to the emergency room with  saturations of 85% on room air and is admitted yet again for a similar  process as before.  X-rays show increased infiltration.   PAST HISTORY:  Medical:  1.  History of pulmonary embolism and ARDS with respiratory failure,      tracheotomy tube, and prolonged rehab course in May 2005. History of      pulmonary embolism with this.  2.  History of anxiety.  3.  History of tobacco use.   MEDICATIONS PRIOR TO ADMISSION:  1.  Xanax 1 mg t.i.d.  2.  Lexapro 30 mg daily.  3.  Albuterol p.r.n.  4.  Seroquel 75 mg h.s.   SOCIAL HISTORY:  The patient lives with  her husband, still smokes a pack a  day of cigarettes daily.   FAMILY HISTORY:  Noncontributory.   REVIEW OF SYSTEMS:  Noncontributory.   PHYSICAL EXAMINATION:  GENERAL:  An ill-appearing, very anxious, white  female in no distress.  VITAL SIGNS:  Temp 99, blood pressure 127/71, pulse 105, sat was 96% on room  air but 85% when ambulates about the unit.  CHEST:  Showed distant breath sounds, a few scattered wheezes.  CARDIAC:  Showed a regular rate and rhythm __________  S3.  Normal S1 S2.  ABDOMEN:  Soft, nontender.  EXTREMITIES:  Showed no edema or clubbing.  SKIN:  Clear.  NEUROLOGIC:  Intact.  HEENT:  Showed no jugular venous distention, no lymphadenopathy.  Oropharynx  clear.  NECK:  Supple.   LABORATORY DATA:  Only lab back is a white count 17,600, hemoglobin 10.9,  platelet count 298.  Other labs pending at the time of this dictation.  Chest x-ray showed increasing bilateral infiltrate under baseline chronic  scarring and bronchiectatic change.  EKG:  Normal sinus rhythm, right atrial  enlargement.   IMPRESSION:  1.  Bronchiectasis with bilateral infiltrates, worsening.  2.  Previous history of acute lung injury and pneumonia with prolonged      course.  3.  History of pulmonary embolism.  4.  History of chronic tracheostomy.  5.  Underlying severe anxiety.  6.  Tobacco use.   RECOMMENDATIONS:  1.  Admit to a regular room.  2.  Administer IV Rocephin, IV Zithromax, IV Solu-Medrol, intensive neb      treatments, oxygen therapy, DVT prophylaxis, nicotine replacement      therapy.  3.  Increase Xanax to 2 mg q.8h.  4.  Maintain Seroquel, Lexapro as is.  5.  Continue to pursue smoking cessation.      PW/MEDQ  D:  12/18/2004  T:  12/18/2004  Job:  [id number]   cc:   Dellis Anes. Idell Pickles, M.D.  [address]  [place]  [place] [address]  Fax: [phone]

## 2011-01-14 NOTE — Discharge Summary (Signed)
[name], [name]                  ACCOUNT NO.:  [id number]   MEDICAL RECORD NO.:  [id number]          PATIENT TYPE:  INP   LOCATION:  2005                         FACILITY:  [facility]   PHYSICIAN:  Corinna L. Lendell Caprice, MDDATE OF BIRTH:  06/03/1961   DATE OF ADMISSION:  09/02/2004  DATE OF DISCHARGE:  09/09/2004                                 DISCHARGE SUMMARY   DIAGNOSES:  1.  Community acquired pneumonia.  2.  Tobacco abuse.  3.  History of anxiety disorder.  4.  History of pulmonary embolus and adult respiratory distress syndrome      requiring tracheostomy and prolonged period of rehabilitation.   DISCHARGE MEDICATIONS:  1.  Ceftin 500 mg p.o. b.i.d. until gone.  2.  Wellbutrin SR for smoking cessation as directed.  3.  Continue her other outpatient medications. Please see H&P for details.   FOLLOW UP:  Dr. Chales Salmon. [place] in one to two weeks.   ACTIVITY:  No heavy exertion for one to two weeks.   DIET:  Regular.   CONDITION:  Stable.   CONSULTATIONS:  None.   PROCEDURES:  None.   LABORATORY DATA:  C. difficile negative. Blood cultures negative x 4.  Complete metabolic panel significant for potassium of 2.8, albumin of 2.7,  AST of 94, otherwise unremarkable. At discharge, her potassium was 4.1.  PT/PTT normal. CBC on admission was significant for white blood cell count  of 19,000 with greater than 20% bands. Hemoglobin was 11.4, hematocrit 34.4.  At discharge, her white blood cell count was 6.6, hemoglobin 9.2, hematocrit  28.7, platelet count was initially 411 but normalized. Initially, her ABG on  100% non-rebreather revealed a pH of 7.259, pCO2 of 52, pO2 of 70. At  discharge, her room air blood gas revealed a pH of 7.40, pCO2 47 and pO2  106.   Chest x-ray showed bibasilar pneumonia. This was followed serially.  EKG on  admission shows sinus tachycardia, right atrial enlargement and nonspecific  changes.   HISTORY AND HOSPITAL COURSE:  Ms. Ashley [name] is a  50 year old white female who  was seen initially in her primary care physician's office with a cough. She  reportedly had an oxygen saturation of about 74% at that time and was sent  to the emergency room. Her oxygen saturation on non-rebreather mask was 96%,  temperature 100.5, blood pressure 117/65, pulse 118, respiratory rate was  24. She was very anxious but able to speak in short sentences. She had  bilateral rhonchi and labs and x-rays were consistent with community-  acquired pneumonia. Due to the hypoxia, she was placed on the step-down unit  and given azithromycin and Rocephin as well as hand-held nebulizers.   Her hospital course was one of steady improvement and at the time of  discharge her oxygen saturation was 94% on room air. She remained extremely  anxious throughout her hospitalization by her lungs were clear. Her white  blood cell count and oxygen saturation had normalized and her chest x-rays  were stable. So she was discharged with outpatient follow-up. This completes  my dictation,  thanks      CLS/MEDQ  D:  11/09/2004  T:  11/09/2004  Job:  [id number]   cc:   Dellis Anes. Idell Pickles, M.D.  [address]  [place]  [place] [address]  Fax: [phone]

## 2011-01-14 NOTE — H&P (Signed)
Ashley [name], Ashley [name]                  ACCOUNT NO.:  [id number]   MEDICAL RECORD NO.:  [id number]          PATIENT TYPE:  EMS   LOCATION:  MAJO                         FACILITY:  [facility]   PHYSICIAN:  Corinna L. Lendell Caprice, MDDATE OF BIRTH:  1961/07/18   DATE OF ADMISSION:  09/02/2004  DATE OF DISCHARGE:                                HISTORY & PHYSICAL   CHIEF COMPLAINT:  Cough.   HISTORY OF PRESENT ILLNESS:  Ashley [name] is a 50 year old white female who  presents to the hospital from her primary care physician's office with  cough, tachypnea, shortness of breath.  The patient reportedly had oxygen  saturations of 76% in the office on room air.  She is on a non-rebreather  mass currently, and her oxygen levels are in the 95th to 96th percentile  range.  She reports subjective fevers and chills.  She has a cough  productive of yellow sputum.  She has had no chest pain.  She has had sinus  congestion, sore throat, headache.  She has a history of pulmonary embolus  and ARDS with respiratory failure and a prolonged hospital in May and June  of 2005.  She continues to smoke cigarettes.  She is extremely anxious, and  has required benzodiazepines here in the emergency room.  She apparently was  very tearful and agitated.  She is feeling better with oxygen in place.  She  has had blood cultures drawn.  Azithromycin and Rocephin given.   PAST MEDICAL HISTORY:  1.  PE and ARDS with respiratory failure, tracheostomy, which has been      removed, and a prolonged period of rehabilitation for deconditioning.  2.  Anxiety.  3.  Tobacco abuse.   MEDICATIONS:  1.  Xanax 1 mg p.o. t.i.d.  2.  Lexapro 10 mg p.o. daily.  3.  Albuterol M.D.I. p.r.n.  (She has completed her Coumadin course, as it has been 6 months since her  pulmonary embolus.)   SOCIAL HISTORY:  The patient is here with her husband.  She smokes a pack of  cigarettes a day.  Does not drink.   FAMILY HISTORY:  Noncontributory.   REVIEW  OF SYSTEMS:  As above.  Otherwise negative.   PHYSICAL EXAMINATION:  VITAL SIGNS:  Her oxygen saturation on nonrebreather  mask is 96%.  Temperature is 100.5, blood pressure 117/65, pulse 118,  respiratory rate 24.  GENERAL:  The patient is a very anxious-appearing white female who is able  to speak in short sentences.  HEENT:  Normocephalic and atraumatic.  Pupils equal, round and reactive to  light.  Sclerae are anicteric.  No conjunctivitis.  She has moist mucous  membranes.  NECK:  Supple.  No lymphadenopathy.  LUNGS:  With rhonchi bilaterally.  No rales.  CARDIOVASCULAR:  Regular rate and rhythm without murmurs, gallops, or rubs.  ABDOMEN:  Soft, nontender, nondistended.  GU/RECTAL:  Deferred.  EXTREMITIES:  No clubbing, cyanosis, or edema.  SKIN:  No rash.  PSYCHIATRIC:  Very anxious.  NEUROLOGIC:  Alert and oriented.  Cranial nerves and sensory motor exam are  grossly intact.  SKIN:  No rash.   LABORATORY DATA:  White blood cell count is 19,000 with greater than 20%  bands.  Hemoglobin 11.4, hematocrit 34.4, platelet count 411.  Complete  metabolic panel is significant for a potassium of 2.8, AST of 94, albumin of  2.7.  Otherwise, unremarkable.  EKG shows sinus tachycardia with nonspecific  changes and right atrial enlargement.  Chest x-ray shows bilateral patchy  pneumonia.   ASSESSMENT AND PLAN:  1.  Bilateral pneumonia with hypoxia.  The patient has received antibiotics.      I will continue azithromycin and Rocephin.  She will continue oxygen,      hand-held nebulizer treatments.  She will be placed in the step-down      unit.  2.  Hyponatremia.  This will be repleted IV and p.o., and I will order a      magnesium level.  3.  Anxiety.  Continue her Xanax, Lexapro, and give p.r.n. Ativan.  4.  Continued tobacco abuse.  Counseled against.      Cori   CLS/MEDQ  D:  09/03/2004  T:  09/03/2004  Job:  [id number]   cc:   Dellis Anes. Idell Pickles, M.D.  [address]   [place]  [place] [address]  Fax: [phone]

## 2011-01-14 NOTE — Op Note (Signed)
Ashley [name], Ashley [name]                            ACCOUNT NO.:  [id number]   MEDICAL RECORD NO.:  [id number]                   PATIENT TYPE:  INP   LOCATION:  2301                                 FACILITY:  [facility]   PHYSICIAN:  Ines Bloomer, M.D.              DATE OF BIRTH:  February 21, 1961   DATE OF PROCEDURE:  01/19/2004  DATE OF DISCHARGE:                                 OPERATIVE REPORT   PREOPERATIVE DIAGNOSIS:  Respiratory failure.   POSTOPERATIVE DIAGNOSIS:  Respiratory failure.   OPERATION PERFORMED:  Percutaneous tracheostomy with a #6 Shiley.   SURGEON:  Ines Bloomer, M.D.   FIRST ASSISTANT:  Coral Ceo, P.A.-C.   This patient came in with respiratory failure and was intubated for 12 days,  was extubated, and developed severe respiratory distress and stridor  immediately post extubation, was reintubated.  She is brought to the  operating room for tracheostomy.  After prep and drape of the neck, a  transverse incision was made and carried down with electrocautery through  the subcutaneous tissue and fascia.  The pretracheal fascia was identified  and the third ring was identified.  Then, the video bronchoscope was passed  through the endotracheal tube.  The endotracheal tube was pulled back to 19  and you could see through the endotracheal the trachea.  Under direct  visualization, a puncture was performed of the anterior trachea with a 14  Angiocath and a guide-wire threaded through the Angiocath.  The Angiocath  was removed.  Over the guide-wire was passed a dilator and then over the  dilator, was passed the graduated dilator and then the #6 Shiley  tracheostomy tube was inserted over an obturator.  The obturator and the  guide-wire were removed.  It was confirmed by direct vision to be in good  position.  Then, was sutured into place with 2-0 nylon and a tracheostomy  holder was applied.  The patient was returned back to the SICU in stable  condition.                                       Ines Bloomer, M.D.    DPB/MEDQ  D:  01/19/2004  T:  01/19/2004  Job:  [id number]

## 2011-01-14 NOTE — H&P (Signed)
NAME:  Ashley [name], Ashley [name]                            ACCOUNT NO.:  [id number]   MEDICAL RECORD NO.:  [id number]                   PATIENT TYPE:  INP   LOCATION:  3309                                 FACILITY:  [facility]   PHYSICIAN:  Isla Pence, M.D.             DATE OF BIRTH:  Dec 17, 1960   DATE OF ADMISSION:  12/29/2003  DATE OF DISCHARGE:                                HISTORY & PHYSICAL   IDENTIFICATION STATEMENT:  This is a 50 year old white female with no  significant past medical history, whose primary care physician is Dr. Dellis Anes. Heller.   CHIEF COMPLAINT:  The patient was [facility] by Dr. Tracey Harries for possible PE and  pneumomediastinum.   HISTORY OF PRESENT ILLNESS:  This patient, with no significant past medical  history, was doing well until Saturday, after cleaning out the kitchen that  is being remodeled with significant cabinet particles and dust particles,  when she started coughing.  The cough apparently worsened Saturday night and  she went into 2 hours of continuous nonstop coughing.  This was followed by  an acute chest pain substernally which went into the neck.  She had gone to  the emergency room on Saturday night and according to the records there, it  shows that her room air saturations were 98%; she had a respiratory rate of  17.  She was diagnosed with having an allergic reaction to the dust  particles, since she had been from cleaning out the cabinets that were  remodeled, and given nebulizer treatments and sent home on prednisone of 60  mg, Pepcid and albuterol inhaler.  The patient, today, was seen at the  [facility] by Dr. Everlene Other, who subsequently called the  [facility] hospitalists, since he had obtained chest x-ray, CBC and EKG, with the  chest x-ray showing pneumomediastinum, increased white count of 21,800,  which was felt to be demargination.  Her room air saturations were in the  80s at the urgent care, 98% on 4 L, but she was desaturating with exertion.  The patient was subsequently sent to the emergency room for admission for  further management of her pain control with a pneumomediastinum and to rule  out a PE in this patient with low oxygen saturation.  The patient has been  very active and, in fact, has been cleaning out the house that they are  planning on selling, therefore, had been using numerous chemical sprays over  the past month also, but she did not have any problems up until Saturday.  She denies any reflux symptoms.  She denies any dysphagia, has not had any  vomiting except for 1 episode at the urgent care today -- is what she tells  me -- when she stood up to get her chest x-ray and felt a little dizzy,  otherwise denies any  fevers, sore throat or headache.  She is a smoker for  about a pack per day for the past year.  She had quit 6 years prior to that  and prior to that, she used to smoke a pack per day since the age of 49.   CURRENT MEDICATIONS:  Current medications as were given to her by the  emergency room physician on Saturday, otherwise, none.   PAST MEDICAL HISTORY:  Past medical history is negative.  She denies history  of asthma or reflux or diabetes mellitus or hypertension or heart murmurs.   PAST SURGICAL HISTORY:  Past surgical history is negative.   ALLERGIES:  No known drug allergies.   SOCIAL HISTORY:  She has been married for the past 25 years.  She will be  celebrating her 25th anniversary this mid-May.  She is a Futures trader.  She has  got 3 kids.  Tobacco history is as listed in the HPI.  Alcohol intake:  She  drinks 3-4 beers sometimes on weekends at one time.  No other drug use.   FAMILY HISTORY:  There is hypertension in the father, brain aneurysm in the  paternal aunt.  There is no diabetes mellitus, hypertension or lung or  clotting problems in the family.   REVIEW OF SYSTEMS:  Review of systems is as per HPI.  She  otherwise denies  chronic allergies including seasonal allergies, denies any change in her  bowel habits; no melena or hematochezia; no urinary symptoms either.   PHYSICAL EXAM:  INITIAL VITAL SIGNS IN THE ER:  blood pressure of 112/53,  saturations were 100% on 100% O2.  Her pulse rate was 98%.  GENERAL:  In general, she appears to be in distress with pain and was saying  that she was having trouble breathing.  Once the nasal cannula was removed  and she just had the face mask on, her breathing, she reports, was much  better.  She said she felt like as if she were not getting oxygen, although  we were telling her that she was saturating very well.  She does acknowledge  that some of it is also a sense of anxiety with a sense of shortness of  breath.  HEENT/NECK:  I did out feel any subcutaneous emphysematous type of crepitus  on the neck and her oropharynx is without any erythema.  LUNGS:  She actually has good breath sounds in all of her lung fields.  I do  not hear any crackles.  There are no wheezes either.  She is moving air in  all of her lung fields.  Once again, I do not feel any subcutaneous  crackling/crepitus on exam in her chest either.  HEART:  In the left lateral decubitus position, I do not hear any crunching  sounds with the heart beat either.  ABDOMEN:  Bowel sounds are normal, soft and nontender.  NEUROLOGIC:  There is no focal neurological deficits.   LABORATORY AND ACCESSORY CLINICAL DATA:  A CBC that was done at the clinic  shows a total white count of 21,800 with hemoglobin of 12.4 and hematocrit  of 38.7, platelet count of 397,000.   An EKG was also done at the clinic at the urgent care, and that showed sinus  tachycardia and some right atrial enlargement but otherwise no other  significant pathology is noted.   Chest x-ray:  Clearly, you can see the pneumomediastinum and is very well- visualized on the lateral position.  You can also see  it extending up into   the neck but not anywhere else in the chest subcutaneous tissues.  Heart is  normal size.  She has well-expanded lungs.  There might be some patchy  atelectasis in the right base; I showed it to the radiologist; he does not  come out and call it an infiltrative process.   A BMP has been ordered and a repeat CBC has been ordered here, also a PT and  PTT.   ASSESSMENT AND PLAN:  1. Pneumomediastinum secondary to the intense coughing spell with the     alveolar rupture.  I will admit her for pain control and 100% oxygen.  We     will also give her an anxiolytic.  She does have a pneumomediastinum and     a small right apical pneumothorax.  2. With a question of pulmonary embolus, I doubt that she really has a     pulmonary embolism but we will need to rule this out because of the acute     nature of her symptoms and the hypoxia, therefore, a CT scan of the chest     is being done on this patient, even as I am dictating this.  In the     meantime, she has been order deep venous thrombosis prophylaxis with     Lovenox.  If she does have pulmonary embolus, she will be placed on the     usual dosing for pulmonary embolus.  3. In regards to leukocytosis, I believe this is secondary to demargination     but question of whether she might have an infiltrate process in the right     base.  We will go ahead and use this and try some erythromycin.     Initially, I was thinking pertussis, but clearly her story is clearly     suggestive of allergic pneumonitis type of reaction.  __________ for     pertussis, there is no evidence of fever and it was acute in nature.  We     will go ahead and repeat her white count tomorrow also, in the morning.     I will obtain just 1 set of blood cultures, but clinically she does not     look septic at all that would suggest an infectious process for her     leukocytosis, but probably once again is demargination and an acute     pneumonitis type of a reaction.  We  will see if the differential shows [place]     any other further suggestions.  4. In regards to her cough, I believe this is secondary to the pneumonitis     type of reaction.  We will give her the Solu-Medrol and antitussives.  We     will also use Claritin; once again, the antibiotics for possible coverage     of infiltrative process.  5. In regards to gastrointestinal prophylaxis, we will place on Protonix     intravenously.                                                Isla Pence, M.D.    RRV/MEDQ  D:  12/29/2003  T:  12/30/2003  Job:  [id number]   cc:   Dellis Anes. Idell Pickles, M.D.  [address]  [place]  [place] [address]  Fax: [phone]  Tracey Harries, M.D.  [address]  [place]  [place] [address]  Fax: [phone]

## 2011-01-14 NOTE — Op Note (Signed)
[name], [name]                            ACCOUNT NO.:  [id number]   MEDICAL RECORD NO.:  [id number]                   PATIENT TYPE:  INP   LOCATION:  2301                                 FACILITY:  [facility]   PHYSICIAN:  Shan Levans, M.D. [facility]            DATE OF BIRTH:  09/11/1960   DATE OF PROCEDURE:  01/22/2004  DATE OF DISCHARGE:                                 OPERATIVE REPORT   PROCEDURE:  Bronchoscopy.   INDICATIONS FOR PROCEDURE:  Hemoptysis, evaluate for bleeding source.   OPERATOR:  Charlcie Cradle. Delford Field, M.D. [facility]   ANESTHESIA:  None.   PREOPERATIVE MEDICATION:  The patient is on IV sedation.   DESCRIPTION OF PROCEDURE:  The bedside Olympus bronchoscope was introduced  through the tracheotomy tube.  The airways were visualized and showed no  evidence of any endobronchial lesion to be a source of hemoptysis.  No  evidence of granulation tissue seen.  The upper airways were also visualized  above the tracheotomy site and also did not reveal any evidence of bleeding  source or source for hemoptysis.  No specimens were obtained.   COMPLICATIONS:  None.   IMPRESSION:  No source of hemoptysis found.   RECOMMENDATIONS:  Continue ventilatory support strategies.                                               Shan Levans, M.D. [place] [facility]    PW/MEDQ  D:  01/22/2004  T:  01/22/2004  Job:  [id number]
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
## 2011-01-14 NOTE — Assessment & Plan Note (Signed)
MEDICAL RECORD NUMBER:  [id number].   Ashley [name] is here on April 06, 2004 for follow up of her deconditioning and  resolving encephalopathy for which he was admitted to the rehab floor in  June. The patient is discharged to home with supervision of her family. She  really has been doing quite well except for problems with endurance and  anxiety. She remains on Lexapro 10 mg a day and Xanax 1 mg t.i.d. She has  had problems with her husband who briefly left her and is now back at home.  Apparently, her son and husband do not get along either. The patient has  some occasional pain in the low back and leg which is  minimal at a 1 to  3/10 level. Still has some endurance issues as related to her breathing but  feels that she is improving. She had a positive followup visit with Dr.  Edwyna Shell recently. She has seen Dr. Eula Flax on one occasional for her  anxiety but has not followed up to this point. The patient has been  discharged from her outpatient therapies as she has made good progress  overall. The patient does feel that she has some difficulties with  depression as related to her other issues.   REVIEW OF SYSTEMS:  The patient denies any chest pain. Does report shortness  of breath, coughing, heart murmur, and ongoing weakness. She denies  dizziness, spasms, stroke, confusion, problems with sleep, or agitation.  Anxiety as noted above. She has occasional headaches which are partly  relived with the Vicodin. She questions whether she can take her Excedrin  for these. She remains on Coumadin for her PE. Appetite is poor. She denies  any fever, swelling, or rash. She has had some weight loss overall since  hospitalization.   PHYSICAL EXAMINATION:  VITAL SIGNS:  Today her blood pressure is 100/66,  pulse 80, respiratory rate 18. She is saturating 99% on room air.  GENERAL:  The patient walks with normal gait style. She has some difficulty  with heel to toe ambulation. She is able to hop on  one foot without  difficulty. Her affect is bright and generally appropriate. She was not  overly anxious today. She is well dressed.  HEART:  Regular rate and rhythm.  LUNGS:  Clear. She has upper airway sounds, but her breathing was unlabored.  NEUROLOGICAL:  Motor exam was 5/5 in all four lower extremities. Sensory  exam was grossly intact. The patient had intact fine motor coordination and  cranial nerve exam III-XII was intact.  SKIN:  Without injury.  EXTREMITIES:  No edema or cyanosis was seen. Pulses were 2+.   ASSESSMENT:  1. Deconditioning status post prolonged hospital course.  2. Pulmonary embolism.  3. Resolved encephalopathy.  4. Anxiety disorder.   PLAN:  1. The patient is doing well physically at this point. She needs to continue     with general exercise at home to increase her endurance.  2. She will continue on Coumadin for at least 6 to 12 months as related to     her PE.  3. For anxiety, we will increase her Lexapro 20 mg a day. She will continue     with the Xanax at 1 mg t.i.d. although I would like to see her cut this     back to 0.5 mg q.8h. The patient agreed to try.  4. The patient will continue on her Seroquel 25 mg three tablets q.h.s.  5. Recommend follow up  with Dr. Leonides Cave for stress management and anxiety     issues.  6. Patient may benefit from psychiatric consult to evaluate her medications     for her anxiety disorder if she does not have further improvement after     resolution of her psychosocial stressors.  7. I felt it was okay to take an occasional Excedrin Migraine for her     headaches which are rare. I recommend no     more than one time a week taking Excedrin.  8. I will see the patient back on an as needed basis in the future.      Ranelle Oyster, M.D.   ZTS/MedQ  D:  04/06/2004 16:21:38  T:  04/06/2004 17:07:31  Job #:  [id number]   cc:   Dellis Anes. Idell Pickles, M.D.  [address]  [place]  [place] [address]  Fax: [phone]

## 2011-01-14 NOTE — Discharge Summary (Signed)
NAME:  Ashley [name], Ashley [name]                  ACCOUNT NO.:  [id number]   MEDICAL RECORD NO.:  [id number]          PATIENT TYPE:  INP   LOCATION:  5730                         FACILITY:  [facility]   PHYSICIAN:  Corinna L. Lendell Caprice, MDDATE OF BIRTH:  [birth year]   DATE OF ADMISSION:  12/29/2003  DATE OF DISCHARGE:  02/18/2004                                 DISCHARGE SUMMARY   DIAGNOSES:  1.  Resolved acute respiratory distress syndrome.  2.  Pulmonary embolus.  3.  Deconditioning.  4.  Oropharyngeal dysphagia, repeat modified barium swallow pending.  5.  Depression.  6.  Anxiety.  7.  Resolved encephalopathy.   DISCHARGE MEDICATIONS:  1.  Coumadin per pharmacy for an INR between 2 and 3.  2.  Lovenox per pharmacy until INR is greater than or equal to 2.0.  3.  Prednisone taper.  4.  Lexapro 10 mg daily.  5.  Iron sulfate 325 mg daily.  6.  Vicodin one q.6h p.r.n. pain.  She is still receiving tube feeds per      nutrition.  7.  Xanax per chart.  Please see orders.   HISTORY AND HOSPITAL COURSE:  Very briefly, [name] is a 50 year old white  female who was found to have pneumomediastinum with a small right apical  pneumothorax.  She was seen by CVTS and pulmonary critical care.  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  Please see chart  for details.  She eventually was extubated, but was extremely debilitated.  She was having oropharyngeal dysphagia and required tube feedings.  It was  felt that this was due to her severe weakness and that it would improve  eventually.  The patient had confusion and encephalopathy which eventually  cleared.  However, she continued to have severe depression and anxiety.  She  is being transferred to rehab for further PT, OT, and repeat modified barium  swallow eventually to ensure return of her swallowing function.      Cori   CLS/MEDQ  D:  06/07/2004  T:   06/08/2004  Job:  [id number]

## 2011-06-08 LAB — DIFFERENTIAL
Basophils Absolute: 0.3 — ABNORMAL HIGH
Basophils Relative: 2 — ABNORMAL HIGH
Eosinophils Absolute: 0.4
Eosinophils Relative: 2
Lymphocytes Relative: 26
Lymphs Abs: 4 — ABNORMAL HIGH
Monocytes Absolute: 1.3 — ABNORMAL HIGH
Monocytes Relative: 9
Neutro Abs: 9.5 — ABNORMAL HIGH
Neutrophils Relative %: 62

## 2011-06-08 LAB — POCT I-STAT CREATININE
Creatinine, Ser: 1.2
Operator id: 282201

## 2011-06-08 LAB — I-STAT 8, (EC8 V) (CONVERTED LAB)
BUN: 5 — ABNORMAL LOW
Bicarbonate: 26.6 — ABNORMAL HIGH
Chloride: 107
Glucose, Bld: 94
HCT: 42
Hemoglobin: 14.3
Operator id: 282201
Potassium: 4
Sodium: 140
TCO2: 28
pCO2, Ven: 48.4
pH, Ven: 7.348 — ABNORMAL HIGH

## 2011-06-08 LAB — RAPID URINE DRUG SCREEN, HOSP PERFORMED
Amphetamines: NOT DETECTED
Barbiturates: NOT DETECTED
Benzodiazepines: POSITIVE — AB
Cocaine: POSITIVE — AB
Opiates: NOT DETECTED
Tetrahydrocannabinol: NOT DETECTED

## 2011-06-08 LAB — CBC
HCT: 36.2
Hemoglobin: 12.2
MCHC: 33.8
MCV: 86
Platelets: 475 — ABNORMAL HIGH
RBC: 4.2
RDW: 14.8 — ABNORMAL HIGH
WBC: 15.5 — ABNORMAL HIGH

## 2011-06-08 LAB — ETHANOL: Alcohol, Ethyl (B): 168 — ABNORMAL HIGH

## 2012-12-05 ENCOUNTER — Other Ambulatory Visit: Payer: Self-pay | Admitting: Family Medicine

## 2012-12-05 DIAGNOSIS — J449 Chronic obstructive pulmonary disease, unspecified: Secondary | ICD-10-CM

## 2012-12-11 ENCOUNTER — Ambulatory Visit
Admission: RE | Admit: 2012-12-11 | Discharge: 2012-12-11 | Disposition: A | Discharge status: Home / Self Care | Payer: BC Managed Care – PPO | Source: Ambulatory Visit | Attending: Family Medicine | Admitting: Family Medicine

## 2012-12-11 DIAGNOSIS — J449 Chronic obstructive pulmonary disease, unspecified: Secondary | ICD-10-CM

## 2012-12-11 MED ORDER — IOHEXOL 300 MG/ML  SOLN
75.0000 mL | Freq: Once | INTRAMUSCULAR | Status: AC | PRN
  Administered 2012-12-11: 75 mL via INTRAVENOUS

## 2018-01-18 ENCOUNTER — Other Ambulatory Visit: Payer: Self-pay | Admitting: Family Medicine

## 2018-01-18 DIAGNOSIS — Z1231 Encounter for screening mammogram for malignant neoplasm of breast: Secondary | ICD-10-CM

## 2018-02-09 ENCOUNTER — Ambulatory Visit
Admission: RE | Admit: 2018-02-09 | Discharge: 2018-02-09 | Disposition: A | Discharge status: Home / Self Care | Payer: BLUE CROSS/BLUE SHIELD | Source: Ambulatory Visit | Attending: Family Medicine | Admitting: Family Medicine

## 2018-02-09 DIAGNOSIS — Z1231 Encounter for screening mammogram for malignant neoplasm of breast: Secondary | ICD-10-CM

## 2019-07-31 LAB — LIPID PANEL: Cholesterol: 219 — AB (ref 0–200)

## 2020-07-16 ENCOUNTER — Encounter (HOSPITAL_COMMUNITY): Payer: Self-pay

## 2020-07-16 ENCOUNTER — Inpatient Hospital Stay (HOSPITAL_COMMUNITY)
Admission: EM | Admit: 2020-07-16 | Discharge: 2020-07-20 | DRG: 871 | Disposition: A | Discharge status: Home / Self Care | Payer: 59 | Attending: Family Medicine | Admitting: Family Medicine

## 2020-07-16 ENCOUNTER — Emergency Department (HOSPITAL_COMMUNITY): Payer: 59

## 2020-07-16 ENCOUNTER — Other Ambulatory Visit: Payer: Self-pay

## 2020-07-16 DIAGNOSIS — R0902 Hypoxemia: Secondary | ICD-10-CM

## 2020-07-16 DIAGNOSIS — J440 Chronic obstructive pulmonary disease with acute lower respiratory infection: Secondary | ICD-10-CM | POA: Diagnosis present

## 2020-07-16 DIAGNOSIS — D696 Thrombocytopenia, unspecified: Secondary | ICD-10-CM | POA: Diagnosis present

## 2020-07-16 DIAGNOSIS — I9589 Other hypotension: Secondary | ICD-10-CM | POA: Diagnosis present

## 2020-07-16 DIAGNOSIS — A4189 Other specified sepsis: Secondary | ICD-10-CM | POA: Diagnosis not present

## 2020-07-16 DIAGNOSIS — R6520 Severe sepsis without septic shock: Secondary | ICD-10-CM | POA: Diagnosis present

## 2020-07-16 DIAGNOSIS — J9601 Acute respiratory failure with hypoxia: Secondary | ICD-10-CM | POA: Diagnosis present

## 2020-07-16 DIAGNOSIS — U071 COVID-19: Secondary | ICD-10-CM | POA: Diagnosis present

## 2020-07-16 DIAGNOSIS — F4310 Post-traumatic stress disorder, unspecified: Secondary | ICD-10-CM | POA: Diagnosis present

## 2020-07-16 DIAGNOSIS — E861 Hypovolemia: Secondary | ICD-10-CM | POA: Diagnosis present

## 2020-07-16 DIAGNOSIS — I959 Hypotension, unspecified: Secondary | ICD-10-CM | POA: Diagnosis present

## 2020-07-16 DIAGNOSIS — F17200 Nicotine dependence, unspecified, uncomplicated: Secondary | ICD-10-CM | POA: Diagnosis present

## 2020-07-16 DIAGNOSIS — D649 Anemia, unspecified: Secondary | ICD-10-CM | POA: Diagnosis present

## 2020-07-16 DIAGNOSIS — D72819 Decreased white blood cell count, unspecified: Secondary | ICD-10-CM | POA: Diagnosis present

## 2020-07-16 DIAGNOSIS — E871 Hypo-osmolality and hyponatremia: Secondary | ICD-10-CM | POA: Diagnosis present

## 2020-07-16 DIAGNOSIS — R0602 Shortness of breath: Secondary | ICD-10-CM

## 2020-07-16 DIAGNOSIS — J1282 Pneumonia due to coronavirus disease 2019: Secondary | ICD-10-CM | POA: Diagnosis present

## 2020-07-16 DIAGNOSIS — E876 Hypokalemia: Secondary | ICD-10-CM | POA: Diagnosis present

## 2020-07-16 DIAGNOSIS — Z79899 Other long term (current) drug therapy: Secondary | ICD-10-CM | POA: Diagnosis not present

## 2020-07-16 DIAGNOSIS — E872 Acidosis: Secondary | ICD-10-CM | POA: Diagnosis present

## 2020-07-16 DIAGNOSIS — A419 Sepsis, unspecified organism: Secondary | ICD-10-CM | POA: Diagnosis not present

## 2020-07-16 DIAGNOSIS — Z20822 Contact with and (suspected) exposure to covid-19: Secondary | ICD-10-CM | POA: Diagnosis present

## 2020-07-16 DIAGNOSIS — J209 Acute bronchitis, unspecified: Secondary | ICD-10-CM | POA: Diagnosis not present

## 2020-07-16 DIAGNOSIS — E860 Dehydration: Secondary | ICD-10-CM | POA: Diagnosis present

## 2020-07-16 LAB — CBC WITH DIFFERENTIAL/PLATELET
Abs Immature Granulocytes: 0.03 10*3/uL (ref 0.00–0.07)
Basophils Absolute: 0 10*3/uL (ref 0.0–0.1)
Basophils Relative: 0 %
Eosinophils Absolute: 0 10*3/uL (ref 0.0–0.5)
Eosinophils Relative: 0 %
HCT: 35.3 % — ABNORMAL LOW (ref 36.0–46.0)
Hemoglobin: 11.9 g/dL — ABNORMAL LOW (ref 12.0–15.0)
Immature Granulocytes: 1 %
Lymphocytes Relative: 7 %
Lymphs Abs: 0.3 10*3/uL — ABNORMAL LOW (ref 0.7–4.0)
MCH: 29.6 pg (ref 26.0–34.0)
MCHC: 33.7 g/dL (ref 30.0–36.0)
MCV: 87.8 fL (ref 80.0–100.0)
Monocytes Absolute: 0.1 10*3/uL (ref 0.1–1.0)
Monocytes Relative: 3 %
Neutro Abs: 3.4 10*3/uL (ref 1.7–7.7)
Neutrophils Relative %: 89 %
Platelets: 131 10*3/uL — ABNORMAL LOW (ref 150–400)
RBC: 4.02 MIL/uL (ref 3.87–5.11)
RDW: 12.1 % (ref 11.5–15.5)
WBC: 3.8 10*3/uL — ABNORMAL LOW (ref 4.0–10.5)
nRBC: 0 % (ref 0.0–0.2)

## 2020-07-16 LAB — COMPREHENSIVE METABOLIC PANEL
ALT: 19 U/L (ref 0–44)
AST: 47 U/L — ABNORMAL HIGH (ref 15–41)
Albumin: 2.6 g/dL — ABNORMAL LOW (ref 3.5–5.0)
Alkaline Phosphatase: 41 U/L (ref 38–126)
Anion gap: 10 (ref 5–15)
BUN: 5 mg/dL — ABNORMAL LOW (ref 6–20)
CO2: 18 mmol/L — ABNORMAL LOW (ref 22–32)
Calcium: 6.8 mg/dL — ABNORMAL LOW (ref 8.9–10.3)
Chloride: 103 mmol/L (ref 98–111)
Creatinine, Ser: 0.58 mg/dL (ref 0.44–1.00)
GFR, Estimated: >60 mL/min (ref >60)
Glucose, Bld: 114 mg/dL — ABNORMAL HIGH (ref 70–99)
Potassium: 3.4 mmol/L — ABNORMAL LOW (ref 3.5–5.1)
Sodium: 131 mmol/L — ABNORMAL LOW (ref 135–145)
Total Bilirubin: 0.6 mg/dL (ref 0.3–1.2)
Total Protein: 5.1 g/dL — ABNORMAL LOW (ref 6.5–8.1)

## 2020-07-16 LAB — LACTATE DEHYDROGENASE: LDH: 412 U/L — ABNORMAL HIGH (ref 98–192)

## 2020-07-16 LAB — HIV ANTIBODY (ROUTINE TESTING W REFLEX): HIV Screen 4th Generation wRfx: NONREACTIVE

## 2020-07-16 LAB — PROCALCITONIN: Procalcitonin: <0.1 ng/mL

## 2020-07-16 LAB — C-REACTIVE PROTEIN: CRP: 10.9 mg/dL — ABNORMAL HIGH (ref <1.0)

## 2020-07-16 LAB — FERRITIN: Ferritin: 1561 ng/mL — ABNORMAL HIGH (ref 11–307)

## 2020-07-16 LAB — TROPONIN I (HIGH SENSITIVITY): Troponin I (High Sensitivity): 3 ng/L (ref <18)

## 2020-07-16 LAB — FIBRINOGEN: Fibrinogen: 506 mg/dL — ABNORMAL HIGH (ref 210–475)

## 2020-07-16 MED ORDER — SODIUM CHLORIDE 0.9 % IV SOLN
200.0000 mg | Freq: Once | INTRAVENOUS | Status: AC
  Administered 2020-07-16: 200 mg via INTRAVENOUS
  Filled 2020-07-16: qty 40

## 2020-07-16 MED ORDER — SODIUM CHLORIDE 0.9 % IV SOLN
100.0000 mg | Freq: Every day | INTRAVENOUS | Status: AC
  Administered 2020-07-17 – 2020-07-20 (×4): 100 mg via INTRAVENOUS
  Filled 2020-07-16 (×4): qty 20

## 2020-07-16 MED ORDER — DEXAMETHASONE SODIUM PHOSPHATE 10 MG/ML IJ SOLN
10.0000 mg | Freq: Once | INTRAMUSCULAR | Status: AC
  Administered 2020-07-16: 10 mg via INTRAVENOUS
  Filled 2020-07-16: qty 1

## 2020-07-16 MED ORDER — SODIUM CHLORIDE 0.9 % IV BOLUS
1000.0000 mL | Freq: Once | INTRAVENOUS | Status: AC
  Administered 2020-07-16: 1000 mL via INTRAVENOUS

## 2020-07-16 NOTE — H&P (Signed)
History and Physical   Ashley [name] NTI:[id number] DOB: Jan 15, 1961 DOA: 07/16/2020  Referring MD/NP/PA: Dr. Eulis Foster  PCP: Patient, No Pcp Per   Outpatient Specialists: None  Patient coming from: Home  Chief Complaint: Shortness of breath and cough  HPI: Ashley [name] is a 59 y.o. female with medical history significant of COPD, tobacco use as well as previous ARDS who presented to the ER with shortness of breath and cough.  Patient was brought by EMS from urgent care with hypotension and hypoxia.  Her oxygen sat was 88% on room air.  She received about 1300 cc of normal saline bolus via EMS.  When she arrived the ER blood pressure was 87/58.  She is dizzy.  She apparently lives at home with her husband who is Covid positive.  She has been having headaches chills with a shortness of breath cough and fatigue.  Symptoms have been going on for about 10 days.  Patient has also lost her appetite.  Patient seen and evaluated in the ER.  Covid screen is currently pending but all inflammatory markers are suggestive of Covid pneumonia.  She meets criteria for severe sepsis based on leukopenia, respiratory rate of 22, and evidence of infection.  Patient requiring 2 L of oxygen at the moment which is new oxygen requirement.  Patient also needs severe sepsis criteria with systolic blood pressure less than 90.  She is being admitted as a PUI with high likelihood of this being COVID-19 infection..  ED Course: Temperature 98.2 blood pressure 82/60 pulse 93 respiratory 25 and oxygen sats 93% on room air.  Her white count is 3.8 hemoglobin 11.9 and platelets 131.  Sodium 131 potassium 3.4 chloride 103 CO2 of 18 BUN 5 creatinine 1.5 and calcium 6.8.  Glucose is 114.  LDH 412 troponin of 3 ferritin 1561 CRP of 10.9 procalcitonin less than 0.1.  Fibrinogen 506.  Chest x-ray showed patchy bilateral airspace opacity concerning for Covid pneumonia.  Respiratory panel currently pending.  Patient will be admitted as a PUI  with severe sepsis.  Review of Systems: As per HPI otherwise 10 point review of systems negative.    History reviewed. No pertinent past medical history.  History reviewed. No pertinent surgical history.   reports that she has quit smoking. She has never used smokeless tobacco. She reports previous alcohol use. She reports previous drug use.  No Known Allergies  History reviewed. No pertinent family history.   Prior to Admission medications   Medication Sig Start Date End Date Taking? Authorizing Provider  ALPRAZolam Duanne Moron) 0.5 MG tablet Take 0.5 mg by mouth 2 (two) times daily as needed for anxiety.  05/29/20  Yes [provider]  Ascorbic Acid (VITAMIN C) 1000 MG tablet Take 1,000 mg by mouth daily.   Yes [provider]  traZODone (DESYREL) 100 MG tablet Take 100 mg by mouth at bedtime as needed for sleep.  06/03/20  Yes [provider]    Physical Exam: Vitals:   07/16/20 2115 07/16/20 2130 07/16/20 2145 07/16/20 2200  BP: 96/63 (!) 83/64 (!) 93/44 (!) 89/59  Pulse: 71 72 78 79  Resp: 20 12 14 16   Temp:      TempSrc:      SpO2: 98% 98% 97% 93%  Weight:      Height:          Constitutional: Acutely ill looking in mild distress Vitals:   07/16/20 2115 07/16/20 2130 07/16/20 2145 07/16/20 2200  BP: 96/63 (!) 83/64 Marland Kitchen)  93/44 (!) 89/59  Pulse: 71 72 78 79  Resp: 20 12 14 16   Temp:      TempSrc:      SpO2: 98% 98% 97% 93%  Weight:      Height:       Eyes: PERRL, lids and conjunctivae normal ENMT: Mucous membranes are moist. Posterior pharynx clear of any exudate or lesions.Normal dentition.  Neck: normal, supple, no masses, no thyromegaly Respiratory: Decreased air entry bilaterally, coarse breath sound, mild expiratory wheezing, mild rhonchi no accessory muscle use.  Cardiovascular: Sinus tachycardia, no murmurs / rubs / gallops. No extremity edema. 2+ pedal pulses. No carotid bruits.  Abdomen: no tenderness, no masses palpated. No  hepatosplenomegaly. Bowel sounds positive.  Musculoskeletal: no clubbing / cyanosis. No joint deformity upper and lower extremities. Good ROM, no contractures. Normal muscle tone.  Skin: no rashes, lesions, ulcers. No induration Neurologic: CN 2-12 grossly intact. Sensation intact, DTR normal. Strength 5/5 in all 4.  Psychiatric: Normal judgment and insight. Alert and oriented x 3. Normal mood.     Labs on Admission: I have personally reviewed following labs and imaging studies  CBC: Recent Labs  Lab 07/16/20 1927  WBC 3.8*  NEUTROABS 3.4  HGB 11.9*  HCT 35.3*  MCV 87.8  PLT 782*   Basic Metabolic Panel: Recent Labs  Lab 07/16/20 1927  NA 131*  K 3.4*  CL 103  CO2 18*  GLUCOSE 114*  BUN 5*  CREATININE 0.58  CALCIUM 6.8*   GFR: Estimated Creatinine Clearance: 56.2 mL/min (by C-G formula based on SCr of 0.58 mg/dL). Liver Function Tests: Recent Labs  Lab 07/16/20 1927  AST 47*  ALT 19  ALKPHOS 41  BILITOT 0.6  PROT 5.1*  ALBUMIN 2.6*   No results for input(s): LIPASE, AMYLASE in the last 168 hours. No results for input(s): AMMONIA in the last 168 hours. Coagulation Profile: No results for input(s): INR, PROTIME in the last 168 hours. Cardiac Enzymes: No results for input(s): CKTOTAL, CKMB, CKMBINDEX, TROPONINI in the last 168 hours. BNP (last 3 results) No results for input(s): PROBNP in the last 8760 hours. HbA1C: No results for input(s): HGBA1C in the last 72 hours. CBG: No results for input(s): GLUCAP in the last 168 hours. Lipid Profile: No results for input(s): CHOL, HDL, LDLCALC, TRIG, CHOLHDL, LDLDIRECT in the last 72 hours. Thyroid Function Tests: No results for input(s): TSH, T4TOTAL, FREET4, T3FREE, THYROIDAB in the last 72 hours. Anemia Panel: Recent Labs    07/16/20 1927  FERRITIN 1,561*   Urine analysis: No results found for: COLORURINE, APPEARANCEUR, LABSPEC, PHURINE, GLUCOSEU, HGBUR, BILIRUBINUR, KETONESUR, PROTEINUR, UROBILINOGEN,  NITRITE, LEUKOCYTESUR Sepsis Labs: @LABRCNTIP (procalcitonin:4,lacticidven:4) )No results found for this or any previous visit (from the past 240 hour(s)).   Radiological Exams on Admission: Portable chest 1 View  Result Date: 07/16/2020 CLINICAL DATA:  Shortness of breath, COVID exposure EXAM: PORTABLE CHEST 1 VIEW COMPARISON:  02/10/2016 FINDINGS: Patchy bilateral peripheral airspace opacities noted in the mid and lower lungs bilaterally. Heart is normal size. No effusions or pneumothorax. No acute bony abnormality. IMPRESSION: Patchy bilateral peripheral airspace opacities concerning for COVID pneumonia. Electronically Signed   By: Rolm Baptise M.D.   On: 07/16/2020 19:25    EKG: Independently reviewed.  It shows sinus tachycardia no significant other findings.  Assessment/Plan Principal Problem:   Severe sepsis (HCC) Active Problems:   SMOKER   COPD with acute bronchitis (HCC)   Hypotension   Hyponatremia   Thrombocytopenia (HCC)   Hypokalemia  Suspected COVID-19 virus infection     #1 severe sepsis: Patient has sepsis with endorgan damage mainly hypotension and hypoxia.  Most likely due to Covid pneumonia.  Currently showing evidence of pneumonia.  We will admit the patient initiate sepsis protocol.  Await viral screen.  Once Covid is confirmed we will start respective medications for Covid pneumonia.  In the meantime hydrate patient.  #2 history of COPD: Appears to have mild exacerbation.  In addition to treatment for pneumonia patient will be on steroids and breathing treatments.  #3 hyponatremia: Most likely due to dehydration.  Hydrate and monitor.  #4 thrombocytopenia: Probably acute viral infections especially the Covid pneumonia.  Monitor platelets.  #5 hypokalemia: Replete potassium.  #6 hypotension: Most likely.  Renal and dehydration.  She has had poor oral intake due to her current illness.  Aggressively hydrate and monitor  #7 suspected COVID-19 infection:  Patient has direct exposure from a partner.  Everything points towards COVID-19 infection.  Awaiting viral assay.   DVT prophylaxis: Lovenox Code Status: Full code Family Communication: No family at bedside Disposition Plan: Home Consults called: None Admission status: Inpatient  Severity of Illness: The appropriate patient status for this patient is INPATIENT. Inpatient status is judged to be reasonable and necessary in order to provide the required intensity of service to ensure the patient's safety. The patient's presenting symptoms, physical exam findings, and initial radiographic and laboratory data in the context of their chronic comorbidities is felt to place them at high risk for further clinical deterioration. Furthermore, it is not anticipated that the patient will be medically stable for discharge from the hospital within 2 midnights of admission. The following factors support the patient status of inpatient.   " The patient's presenting symptoms include cough shortness of breath and hypotension. " The worrisome physical exam findings include hypotension and respiratory distress. " The initial radiographic and laboratory data are worrisome because of evidence of sepsis and pneumonia. " The chronic co-morbidities include COPD.   * I certify that at the point of admission it is my clinical judgment that the patient will require inpatient hospital care spanning beyond 2 midnights from the point of admission due to high intensity of service, high risk for further deterioration and high frequency of surveillance required.Barbette Merino MD [facility] Pager [phone]  If 7PM-7AM, please contact night-coverage [email or web address] Password [facility]  07/16/2020, 10:34 PM

## 2020-07-16 NOTE — ED Provider Notes (Addendum)
[facility] DEPT Provider Note   CSN: [id number] Arrival date & time: 07/16/20  1814     History Chief Complaint  Patient presents with  . Hypotension    Ashley [name] is a 59 y.o. female.  HPI  Ashley [name] presents for evaluation of weakness and hypoxia.  Ashley [name] went to an urgent care with her husband today who was diagnosed with Covid.  The patient was found to be hypoxic and suggested to come to the ED for further evaluation and treatment.  Ashley [name] reports being sick for 12 days with cough, rhinorrhea and general weakness.  Ashley [name] did not think that Ashley [name] had Covid because Ashley [name] continued to have her normal taste and smell.  However Ashley [name] does report decreased oral intake and general weakness during this time.  Ashley [name] denies chest pain, cough, focal weakness or paresthesia.  Ashley [name] has not had Covid vaccines.  Ashley [name] is a smoker.  There are no other known modifying factors.    History reviewed. No pertinent past medical history.  Patient Active Problem List   Diagnosis Date Noted  . SMOKER 10/19/2009  . CHRONIC OBSTRUCTIVE PULMONARY DISEASE 09/16/2009  . 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 COUGH 09/16/2009    History reviewed. No pertinent surgical history.   OB History   No obstetric history on file.     History reviewed. No pertinent family history.  Social History   Tobacco Use  . Smoking status: Former Research scientist (life sciences)  . Smokeless tobacco: Never Used  Substance Use Topics  . Alcohol use: Not Currently  . Drug use: Not Currently    Home Medications Prior to Admission medications   Medication Sig Start Date End Date Taking? Authorizing Provider  ALPRAZolam Duanne Moron) 0.5 MG tablet Take 0.5 mg by mouth 2 (two) times daily as needed for anxiety.  05/29/20  Yes [provider]  Ascorbic Acid (VITAMIN C) 1000 MG tablet Take 1,000 mg by mouth daily.   Yes [provider]  traZODone (DESYREL) 100 MG tablet Take 100 mg by mouth at bedtime as needed  for sleep.  06/03/20  Yes [provider]    Allergies    Patient has no known allergies.  Review of Systems   Review of Systems  All other systems reviewed and are negative.   Physical Exam Updated Vital Signs BP (!) 93/44   Pulse 78   Temp 98.2 F (36.8 C) (Oral)   Resp 14   Ht 5\' 2"  (1.575 m)   Wt 47.6 kg   SpO2 97%   BMI 19.20 kg/m   Physical Exam Vitals and nursing note reviewed.  Constitutional:      General: Ashley [name] is not in acute distress.    Appearance: Ashley [name] is well-developed. Ashley [name] is ill-appearing. Ashley [name] is not toxic-appearing or diaphoretic.  HENT:     Head: Normocephalic and atraumatic.     Right Ear: External ear normal.     Left Ear: External ear normal.  Eyes:     Conjunctiva/sclera: Conjunctivae normal.     Pupils: Pupils are equal, round, and reactive to light.  Neck:     Trachea: Phonation normal.  Cardiovascular:     Rate and Rhythm: Normal rate.     Comments: Hypotension Pulmonary:     Effort: Pulmonary effort is normal.  Abdominal:     General: There is no distension.     Palpations: Abdomen is soft.     Tenderness: There is no abdominal tenderness.  Musculoskeletal:  General: No swelling or tenderness. Normal range of motion.     Cervical back: Normal range of motion and neck supple.  Skin:    General: Skin is warm and dry.     Coloration: Skin is not jaundiced or pale.  Neurological:     Mental Status: Ashley [name] is alert and oriented to person, place, and time.     Cranial Nerves: No cranial nerve deficit.     Sensory: No sensory deficit.     Motor: No abnormal muscle tone.     Coordination: Coordination normal.  Psychiatric:        Mood and Affect: Mood normal.        Behavior: Behavior normal.        Thought Content: Thought content normal.        Judgment: Judgment normal.     ED Results / Procedures / Treatments   Labs (all labs ordered are listed, but only abnormal results are displayed) Labs Reviewed  C-REACTIVE  PROTEIN - Abnormal; Notable for the following components:      Result Value   CRP 10.9 (*)    All other components within normal limits  COMPREHENSIVE METABOLIC PANEL - Abnormal; Notable for the following components:   Sodium 131 (*)    Potassium 3.4 (*)    CO2 18 (*)    Glucose, Bld 114 (*)    BUN 5 (*)    Calcium 6.8 (*)    Total Protein 5.1 (*)    Albumin 2.6 (*)    AST 47 (*)    All other components within normal limits  CBC WITH DIFFERENTIAL/PLATELET - Abnormal; Notable for the following components:   WBC 3.8 (*)    Hemoglobin 11.9 (*)    HCT 35.3 (*)    Platelets 131 (*)    Lymphs Abs 0.3 (*)    All other components within normal limits  FERRITIN - Abnormal; Notable for the following components:   Ferritin 1,561 (*)    All other components within normal limits  FIBRINOGEN - Abnormal; Notable for the following components:   Fibrinogen 506 (*)    All other components within normal limits  LACTATE DEHYDROGENASE - Abnormal; Notable for the following components:   LDH 412 (*)    All other components within normal limits  RESP PANEL BY RT-PCR (FLU A&B, COVID) ARPGX2  PROCALCITONIN  HIV ANTIBODY (ROUTINE TESTING W REFLEX)  TROPONIN I (HIGH SENSITIVITY)  TROPONIN I (HIGH SENSITIVITY)    EKG None  Radiology Portable chest 1 View  Result Date: 07/16/2020 CLINICAL DATA:  Shortness of breath, COVID exposure EXAM: PORTABLE CHEST 1 VIEW COMPARISON:  02/10/2016 FINDINGS: Patchy bilateral peripheral airspace opacities noted in the mid and lower lungs bilaterally. Heart is normal size. No effusions or pneumothorax. No acute bony abnormality. IMPRESSION: Patchy bilateral peripheral airspace opacities concerning for COVID pneumonia. Electronically Signed   By: Rolm Baptise M.D.   On: 07/16/2020 19:25    Procedures .Critical Care Performed by: Daleen Bo, MD Authorized by: Daleen Bo, MD   Critical care provider statement:    Critical care time (minutes):  35    Critical care start time:  07/16/2020 6:45 PM   Critical care end time:  07/16/2020 10:03 PM   Critical care time was exclusive of:  Separately billable procedures and treating other patients   Critical care was necessary to treat or prevent imminent or life-threatening deterioration of the following conditions:  Circulatory failure and sepsis   Critical care was  time spent personally by me on the following activities:  Blood draw for specimens, development of treatment plan with patient or surrogate, discussions with consultants, evaluation of patient's response to treatment, examination of patient, obtaining history from patient or surrogate, ordering and performing treatments and interventions, ordering and review of laboratory studies, pulse oximetry, re-evaluation of patient's condition, review of old charts and ordering and review of radiographic studies   (including critical care time)  Medications Ordered in ED Medications  dexamethasone (DECADRON) injection 10 mg (has no administration in time range)  remdesivir 200 mg in sodium chloride 0.9% 250 mL IVPB (has no administration in time range)    Followed by  remdesivir 100 mg in sodium chloride 0.9 % 100 mL IVPB (has no administration in time range)  sodium chloride 0.9 % bolus 1,000 mL (1,000 mLs Intravenous New Bag/Given 07/16/20 2153)    ED Course  I have reviewed the triage vital signs and the nursing notes.  Pertinent labs & imaging results that were available during my care of the patient were reviewed by me and considered in my medical decision making (see chart for details).  Clinical Course as of Jul 16 2157  Thu Jul 16, 2020  2156 Multifocal infiltrate consistent with Covid infection  Portable chest 1 View [EW]    Clinical Course User Index [EW] Daleen Bo, MD   MDM Rules/Calculators/A&P                           Patient Vitals for the past 24 hrs:  BP Temp Temp src Pulse Resp SpO2 Height Weight  07/16/20  2145 (!) 93/44 -- -- 78 14 97 % -- --  07/16/20 2130 (!) 83/64 -- -- 72 12 98 % -- --  07/16/20 2115 96/63 -- -- 71 20 98 % -- --  07/16/20 2100 97/73 -- -- 79 20 94 % -- --  07/16/20 2045 100/62 -- -- 75 13 98 % -- --  07/16/20 2030 94/60 -- -- 77 13 97 % -- --  07/16/20 2003 -- -- -- 76 18 99 % -- --  07/16/20 2000 (!) 97/59 -- -- 75 15 98 % -- --  07/16/20 1945 -- -- -- 72 15 98 % -- --  07/16/20 1930 98/61 -- -- 93 20 98 % -- --  07/16/20 1915 (!) 93/51 -- -- 71 (!) 22 98 % -- --  07/16/20 1900 95/61 -- -- 73 18 99 % -- --  07/16/20 1845 (!) 108/55 -- -- 76 20 97 % -- --  07/16/20 1841 -- -- -- -- -- -- 5\' 2"  (1.575 m) 47.6 kg  07/16/20 1840 91/80 98.2 F (36.8 C) Oral 76 20 96 % -- --    9:58 PM Reevaluation with update and discussion. After initial assessment and treatment, an updated evaluation reveals no change in clinical status, findings discussed with the patient and all questions were answered. Daleen Bo   Medical Decision Making:  This patient is presenting for evaluation of weakness, which does require a range of treatment options, and is a complaint that involves a high risk of morbidity and mortality. The differential diagnoses include acute viral illness, bacterial illness, metabolic disorder, intravascular volume depletion. I decided to review old records, and in summary middle-aged female lives at home with her husband who has Covid infection, and is presenting with hypoxia and hypotension.  I did not require additional historical information from anyone.  Clinical Laboratory Tests  Ordered, included CBC, Metabolic panel and Covid inflammatory tests. Review indicates findings consistent with Covid infection and significant inflammatory response. Radiologic Tests Ordered, included chest x-ray.  I independently Visualized: Radiographic images, which show viral pneumonia  Cardiac Monitor Tracing which shows normal sinus rhythm   Critical Interventions-clinical  evaluation, laboratory testing, chest x-ray, observation, IV fluids, reassessment  After These Interventions, the Patient was reevaluated and was found with improved oxygenation, normal on nasal cannula at 2 L.  Blood pressure marginal, less than 829 systolic, treated with IV fluids.  Ashley [name] will require hospitalization for stabilization.  Lundyn Coste was evaluated in Emergency Department on 07/16/2020 for the symptoms described in the history of present illness. Ashley [name] was evaluated in the context of the global COVID-19 pandemic, which necessitated consideration that the patient might be at risk for infection with the SARS-CoV-2 virus that causes COVID-19. Institutional protocols and algorithms that pertain to the evaluation of patients at risk for COVID-19 are in a state of rapid change based on information released by regulatory bodies including the CDC and federal and state organizations. These policies and algorithms were followed during the patient's care in the ED.    CRITICAL CARE-yes Performed by: Daleen Bo  Nursing Notes Reviewed/ Care Coordinated Applicable Imaging Reviewed Interpretation of Laboratory Data incorporated into ED treatment  10:03 PM-Consult complete with hospitalist. Patient case explained and discussed.  He agrees to admit patient for further evaluation and treatment. Call ended at 10:25 PM  Plan: Admit   Final Clinical Impression(s) / ED Diagnoses Final diagnoses:  Shortness of breath  COVID-19 virus infection  Hypotension due to hypovolemia  Hypoxia    Rx / DC Orders ED Discharge Orders    None       Daleen Bo, MD 07/16/20 2226    Daleen Bo, MD 07/16/20 2316

## 2020-07-16 NOTE — ED Triage Notes (Signed)
Pt arrived via EMS from urgent care for hypotension and hypoxia. Per EMS pt was 88% on RA. Pts last BP with EMS after receiving 1300 mL of NS was 87/58. PT reports dizziness upon standing. Pt lives at home with someone who is covid positive. Pt reports headache, chills, some shortness of breath,cough, fatigue and loss of appetite x 10 days.

## 2020-07-17 DIAGNOSIS — E871 Hypo-osmolality and hyponatremia: Secondary | ICD-10-CM

## 2020-07-17 DIAGNOSIS — E876 Hypokalemia: Secondary | ICD-10-CM

## 2020-07-17 LAB — CBC
HCT: 36.9 % (ref 36.0–46.0)
Hemoglobin: 12.2 g/dL (ref 12.0–15.0)
MCH: 29.9 pg (ref 26.0–34.0)
MCHC: 33.1 g/dL (ref 30.0–36.0)
MCV: 90.4 fL (ref 80.0–100.0)
Platelets: 148 10*3/uL — ABNORMAL LOW (ref 150–400)
RBC: 4.08 MIL/uL (ref 3.87–5.11)
RDW: 12.2 % (ref 11.5–15.5)
WBC: 2.8 10*3/uL — ABNORMAL LOW (ref 4.0–10.5)
nRBC: 0 % (ref 0.0–0.2)

## 2020-07-17 LAB — RESP PANEL BY RT-PCR (FLU A&B, COVID) ARPGX2
Influenza A by PCR: NEGATIVE
Influenza B by PCR: NEGATIVE
SARS Coronavirus 2 by RT PCR: POSITIVE — AB

## 2020-07-17 LAB — COMPREHENSIVE METABOLIC PANEL
ALT: 21 U/L (ref 0–44)
AST: 48 U/L — ABNORMAL HIGH (ref 15–41)
Albumin: 2.9 g/dL — ABNORMAL LOW (ref 3.5–5.0)
Alkaline Phosphatase: 47 U/L (ref 38–126)
Anion gap: 10 (ref 5–15)
BUN: <5 mg/dL — ABNORMAL LOW (ref 6–20)
CO2: 21 mmol/L — ABNORMAL LOW (ref 22–32)
Calcium: 7.6 mg/dL — ABNORMAL LOW (ref 8.9–10.3)
Chloride: 107 mmol/L (ref 98–111)
Creatinine, Ser: 0.62 mg/dL (ref 0.44–1.00)
GFR, Estimated: >60 mL/min (ref >60)
Glucose, Bld: 168 mg/dL — ABNORMAL HIGH (ref 70–99)
Potassium: 3.7 mmol/L (ref 3.5–5.1)
Sodium: 138 mmol/L (ref 135–145)
Total Bilirubin: 0.4 mg/dL (ref 0.3–1.2)
Total Protein: 5.6 g/dL — ABNORMAL LOW (ref 6.5–8.1)

## 2020-07-17 LAB — CREATININE, SERUM
Creatinine, Ser: 0.64 mg/dL (ref 0.44–1.00)
GFR, Estimated: >60 mL/min (ref >60)

## 2020-07-17 LAB — D-DIMER, QUANTITATIVE: D-Dimer, Quant: 2.15 ug/mL-FEU — ABNORMAL HIGH (ref 0.00–0.50)

## 2020-07-17 LAB — PROTIME-INR
INR: 1.1 (ref 0.8–1.2)
Prothrombin Time: 13.5 seconds (ref 11.4–15.2)

## 2020-07-17 LAB — CORTISOL-AM, BLOOD: Cortisol - AM: 15 ug/dL (ref 6.7–22.6)

## 2020-07-17 LAB — TROPONIN I (HIGH SENSITIVITY): Troponin I (High Sensitivity): 5 ng/L (ref <18)

## 2020-07-17 LAB — PROCALCITONIN: Procalcitonin: 0.11 ng/mL

## 2020-07-17 MED ORDER — ZINC SULFATE 220 (50 ZN) MG PO CAPS
220.0000 mg | ORAL_CAPSULE | Freq: Every day | ORAL | Status: DC
  Administered 2020-07-17 – 2020-07-20 (×4): 220 mg via ORAL
  Filled 2020-07-17 (×4): qty 1

## 2020-07-17 MED ORDER — IPRATROPIUM-ALBUTEROL 20-100 MCG/ACT IN AERS
2.0000 | INHALATION_SPRAY | Freq: Four times a day (QID) | RESPIRATORY_TRACT | Status: DC | PRN
  Filled 2020-07-17: qty 4

## 2020-07-17 MED ORDER — ALPRAZOLAM 0.5 MG PO TABS
0.5000 mg | ORAL_TABLET | Freq: Two times a day (BID) | ORAL | Status: DC | PRN
  Administered 2020-07-17 – 2020-07-20 (×5): 0.5 mg via ORAL
  Filled 2020-07-17 (×5): qty 1

## 2020-07-17 MED ORDER — ACETAMINOPHEN 325 MG PO TABS: 650.0000 mg | ORAL_TABLET | Freq: Four times a day (QID) | ORAL | Status: DC | PRN

## 2020-07-17 MED ORDER — ENOXAPARIN SODIUM 40 MG/0.4ML ~~LOC~~ SOLN
40.0000 mg | SUBCUTANEOUS | Status: DC
  Administered 2020-07-17 – 2020-07-20 (×4): 40 mg via SUBCUTANEOUS
  Filled 2020-07-17 (×5): qty 0.4

## 2020-07-17 MED ORDER — DEXAMETHASONE SODIUM PHOSPHATE 10 MG/ML IJ SOLN
6.0000 mg | INTRAMUSCULAR | Status: DC
  Administered 2020-07-17 – 2020-07-18 (×2): 6 mg via INTRAVENOUS
  Filled 2020-07-17 (×3): qty 1

## 2020-07-17 MED ORDER — SODIUM CHLORIDE 0.9 % IV SOLN
2.0000 g | INTRAVENOUS | Status: DC
  Administered 2020-07-17: 2 g via INTRAVENOUS
  Filled 2020-07-17: qty 20

## 2020-07-17 MED ORDER — ONDANSETRON HCL 4 MG/2ML IJ SOLN: 4.0000 mg | Freq: Four times a day (QID) | INTRAMUSCULAR | Status: DC | PRN

## 2020-07-17 MED ORDER — ONDANSETRON HCL 4 MG PO TABS: 4.0000 mg | ORAL_TABLET | Freq: Four times a day (QID) | ORAL | Status: DC | PRN

## 2020-07-17 MED ORDER — SODIUM CHLORIDE 0.9 % IV SOLN
500.0000 mg | INTRAVENOUS | Status: DC
  Administered 2020-07-17: 500 mg via INTRAVENOUS
  Filled 2020-07-17: qty 500

## 2020-07-17 MED ORDER — ASCORBIC ACID 500 MG PO TABS
500.0000 mg | ORAL_TABLET | Freq: Every day | ORAL | Status: DC
  Administered 2020-07-17 – 2020-07-20 (×4): 500 mg via ORAL
  Filled 2020-07-17 (×4): qty 1

## 2020-07-17 MED ORDER — ENSURE ENLIVE PO LIQD
237.0000 mL | Freq: Two times a day (BID) | ORAL | Status: DC
  Administered 2020-07-18 – 2020-07-20 (×4): 237 mL via ORAL

## 2020-07-17 MED ORDER — TRAZODONE HCL 100 MG PO TABS
100.0000 mg | ORAL_TABLET | Freq: Every evening | ORAL | Status: DC | PRN
  Administered 2020-07-18: 100 mg via ORAL
  Filled 2020-07-17: qty 1

## 2020-07-17 MED ORDER — FAMOTIDINE 20 MG PO TABS
20.0000 mg | ORAL_TABLET | Freq: Every day | ORAL | Status: DC
  Administered 2020-07-17 – 2020-07-20 (×4): 20 mg via ORAL
  Filled 2020-07-17 (×4): qty 1

## 2020-07-17 MED ORDER — ACETAMINOPHEN 650 MG RE SUPP: 650.0000 mg | Freq: Four times a day (QID) | RECTAL | Status: DC | PRN

## 2020-07-17 MED ORDER — LACTATED RINGERS IV SOLN: INTRAVENOUS | Status: DC

## 2020-07-17 MED ORDER — ADULT MULTIVITAMIN W/MINERALS CH
1.0000 | ORAL_TABLET | Freq: Every day | ORAL | Status: DC
  Administered 2020-07-17 – 2020-07-20 (×4): 1 via ORAL
  Filled 2020-07-17 (×4): qty 1

## 2020-07-17 NOTE — Progress Notes (Signed)
Pt is asleep, unable to instruct her on flutter at this time.

## 2020-07-17 NOTE — Plan of Care (Signed)

## 2020-07-17 NOTE — Progress Notes (Signed)
Initial Nutrition Assessment  INTERVENTION:   -Ensure Enlive po BID, each supplement provides 350 kcal and 20 grams of protein -Multivitamin with minerals daily  NUTRITION DIAGNOSIS:   Increased nutrient needs related to acute illness (COVID-19 infection) as evidenced by estimated needs.  GOAL:   Patient will meet greater than or equal to 90% of their needs  MONITOR:   PO intake, Supplement acceptance, Labs, Weight trends, I & O's  REASON FOR ASSESSMENT:   Consult Assessment of nutrition requirement/status  ASSESSMENT:   a 59 y.o. female with medical history significant of COPD, tobacco use as well as previous ARDS who presented to the ER with shortness of breath and cough.  Patient was brought by EMS from urgent care with hypotension and hypoxia. Admitted for severe sepsis.  Patient COVID-19+ since 11/19.  Pt reports symptoms started 10 days PTA. Pt has had poor appetite during this time. Will order Ensure supplements and daily MVI given increased needs from active COVID-19 infection.  No weight history available PTA.   Labs reviewed. Medications: Vitamin C, Zinc sulfate  NUTRITION - FOCUSED PHYSICAL EXAM:  Unable to complete  Diet Order:   Diet Order            Diet regular Room service appropriate? Yes; Fluid consistency: Thin  Diet effective now                 EDUCATION NEEDS:   No education needs have been identified at this time  Skin:  Skin Assessment: Reviewed RN Assessment  Last BM:  11/16  Height:   Ht Readings from Last 1 Encounters:  07/16/20 5\' 2"  (1.575 m)    Weight:   Wt Readings from Last 1 Encounters:  07/17/20 51.3 kg    BMI:  Body mass index is 20.67 kg/m.  Estimated Nutritional Needs:   Kcal:  1500-1700  Protein:  75-90g  Fluid:  1.7L/day  Clayton Bibles, MS, RD, LDN Inpatient Clinical Dietitian Contact information available via Amion

## 2020-07-17 NOTE — Progress Notes (Signed)
PROGRESS NOTE    Ashley [name]  ZDG:[id number] DOB: Mar 18, 1961 DOA: 07/16/2020 PCP: Hulan Fess, MD    Chief Complaint  Patient presents with  . Hypotension    Brief Narrative:   Ashley [name] is a 59 y.o. female with medical history significant of COPD, tobacco use as well as previous ARDS who presented to the ER with shortness of breath and cough.    She reports that her husband was diagnosed with Covid and she has been feeling short of breath since 10 days associated with some cough.  She was admitted to [facility] for evaluation and management of COVID-19. She is currently requiring up to 2 L of nasal cannula oxygen to keep sats greater than 90% Chest x-ray showed patchy bilateral opacities consistent with Covid pneumonia. Patient seen and examined at bedside she appears comfortable and is on 2 L.  Discussed the plan with the daughter and son-in-law over the phone and answered all their questions.  Assessment & Plan:   Principal Problem:   Severe sepsis ([place]) Active Problems:   SMOKER   COPD with acute bronchitis (HCC)   Hypotension   Hyponatremia   Thrombocytopenia (HCC)   Hypokalemia   Suspected COVID-19 virus infection   Severe Sepsis from Covid 19 Pneumonia:  On arrival to ED patient was with a respiratory rate of 59/DGL systolic blood pressure of 82 mmhg,, WBC count is 3.8, with chest x-ray findings of patchy bilateral infiltrates consistent with COVID pneumonia. She is currently requiring 2 L of nasal cannula oxygen to keep sats greater than 90%.  Her sepsis physiology has improved blood pressure has improved. She was started on remdesivir and IV Decadron. Her CRP is 10.9, procalcitonin is less than 0.10, LDH is 412, ferritin is 1561. Continue to follow inflammatory markers.  D-dimer ordered, if elevated will follow up with a CT angiogram of the chest to rule out PE.    Hyponatremia Resolved with hydration.   Hypokalemia Replaced.   Mild metabolic acidosis  improved with IV fluids.    Hypotension Resolved with IV fluids.  Blood pressure has normalized.    Mild thrombocytopenia No signs of bleeding, continue to monitor.   History of active tobacco use/COPD Use inhalers as needed.   DVT prophylaxis: Lovenox.  Code Status: Full code. Family Communication: discussed with daughter over the phone.  Disposition:   Status is: Inpatient  Remains inpatient appropriate because:Ongoing diagnostic testing needed not appropriate for outpatient work up, IV treatments appropriate due to intensity of illness or inability to take PO and Inpatient level of care appropriate due to severity of illness   Dispo: The patient is from: Home              Anticipated d/c is to: pending.               Anticipated d/c date is: > 3 days              Patient currently is not medically stable to d/c.       Consultants:   None.    Procedures: none.   Antimicrobials:  Antibiotics Given (last 72 hours)    Date/Time Action Medication Dose Rate   07/16/20 2234 New Bag/Given   remdesivir 200 mg in sodium chloride 0.9% 250 mL IVPB 200 mg 580 mL/hr   07/17/20 0222 New Bag/Given   cefTRIAXone (ROCEPHIN) 2 g in sodium chloride 0.9 % 100 mL IVPB 2 g 200 mL/hr   07/17/20 0353 New Bag/Given   azithromycin (  ZITHROMAX) 500 mg in sodium chloride 0.9 % 250 mL IVPB 500 mg 250 mL/hr   07/17/20 0913 New Bag/Given   remdesivir 100 mg in sodium chloride 0.9 % 100 mL IVPB 100 mg 200 mL/hr      Subjective: Teary , but appears comfortable.   Objective: Vitals:   07/17/20 0457 07/17/20 0522 07/17/20 0845 07/17/20 1243  BP:  107/63 102/65 (!) 106/58  Pulse:  76 81 67  Resp:  16 20 18   Temp:  97.9 F (36.6 C) 98.1 F (36.7 C) 98 F (36.7 C)  TempSrc:  Oral Oral Oral  SpO2:  98% 90% 96%  Weight: 51.3 kg     Height:        Intake/Output Summary (Last 24 hours) at 07/17/2020 1533 Last data filed at 07/17/2020 0626 Gross per 24 hour  Intake 580.62 ml   Output --  Net 580.62 ml   Filed Weights   07/16/20 1841 07/17/20 0457  Weight: 47.6 kg 51.3 kg    Examination:  General exam: Appears calm and comfortable  Respiratory system: diminished at bases, no wheezing or rhonchi, no tachypnea.  Cardiovascular system: S1 & S2 heard, RRR. No JVD, . No pedal edema. Gastrointestinal system: Abdomen is nondistended, soft and nontender.Normal bowel sounds heard. Central nervous system: Alert and oriented. No focal neurological deficits. Extremities: Symmetric 5 x 5 power. Skin: No rashes, lesions or ulcers Psychiatry:  Mood & affect appropriate.     Data Reviewed: I have personally reviewed following labs and imaging studies  CBC: Recent Labs  Lab 07/16/20 1927 07/17/20 0118  WBC 3.8* 2.8*  NEUTROABS 3.4  --   HGB 11.9* 12.2  HCT 35.3* 36.9  MCV 87.8 90.4  PLT 131* 148*    Basic Metabolic Panel: Recent Labs  Lab 07/16/20 1927 07/17/20 0118  NA 131* 138  K 3.4* 3.7  CL 103 107  CO2 18* 21*  GLUCOSE 114* 168*  BUN 5* <5*  CREATININE 0.58 0.64  0.62  CALCIUM 6.8* 7.6*    GFR: Estimated Creatinine Clearance: 59.1 mL/min (by C-G formula based on SCr of 0.64 mg/dL).  Liver Function Tests: Recent Labs  Lab 07/16/20 1927 07/17/20 0118  AST 47* 48*  ALT 19 21  ALKPHOS 41 47  BILITOT 0.6 0.4  PROT 5.1* 5.6*  ALBUMIN 2.6* 2.9*    CBG: No results for input(s): GLUCAP in the last 168 hours.   Recent Results (from the past 240 hour(s))  Resp Panel by RT-PCR (Flu A&B, Covid) Nasopharyngeal Swab     Status: Abnormal   Collection Time: 07/16/20  7:27 PM   Specimen: Nasopharyngeal Swab; Nasopharyngeal(NP) swabs in vial transport medium  Result Value Ref Range Status   SARS Coronavirus 2 by RT PCR POSITIVE (A) NEGATIVE Final    Comment: RESULT CALLED TO, READ BACK BY AND VERIFIED WITH: CORALDE H. 11.19.21 @ 0216 BY MECIAL J. (NOTE) SARS-CoV-2 target nucleic acids are DETECTED.  The SARS-CoV-2 RNA is generally  detectable in upper respiratory specimens during the acute phase of infection. Positive results are indicative of the presence of the identified virus, but do not rule out bacterial infection or co-infection with other pathogens not detected by the test. Clinical correlation with patient history and other diagnostic information is necessary to determine patient infection status. The expected result is Negative.  Fact Sheet for Patients: EntrepreneurPulse.com.au  Fact Sheet for Healthcare Providers: IncredibleEmployment.be  This test is not yet approved or cleared by the [place] and  has been authorized for detection and/or diagnosis of SARS-CoV-2 by FDA under an Emergency Use Authorization (EUA).  This EUA will remain in effect (meaning this test  can be used) for the duration of  the COVID-19 declaration under Section 564(b)(1) of the Act, 21 U.S.C. section 360bbb-3(b)(1), unless the authorization is terminated or revoked sooner.     Influenza A by PCR NEGATIVE NEGATIVE Final   Influenza B by PCR NEGATIVE NEGATIVE Final    Comment: (NOTE) The Xpert Xpress SARS-CoV-2/FLU/RSV plus assay is intended as an aid in the diagnosis of influenza from Nasopharyngeal swab specimens and should not be used as a sole basis for treatment. Nasal washings and aspirates are unacceptable for Xpert Xpress SARS-CoV-2/FLU/RSV testing.  Fact Sheet for Patients: EntrepreneurPulse.com.au  Fact Sheet for Healthcare Providers: IncredibleEmployment.be  This test is not yet approved or cleared by the [place] FDA and has been authorized for detection and/or diagnosis of SARS-CoV-2 by FDA under an Emergency Use Authorization (EUA). This EUA will remain in effect (meaning this test can be used) for the duration of the COVID-19 declaration under Section 564(b)(1) of the Act, 21 U.S.C. section 360bbb-3(b)(1), unless the  authorization is terminated or revoked.  Performed at [facility], [place] [address], [place], [place] [address]          Radiology Studies: Portable chest 1 View  Result Date: 07/16/2020 CLINICAL DATA:  Shortness of breath, COVID exposure EXAM: PORTABLE CHEST 1 VIEW COMPARISON:  02/10/2016 FINDINGS: Patchy bilateral peripheral airspace opacities noted in the mid and lower lungs bilaterally. Heart is normal size. No effusions or pneumothorax. No acute bony abnormality. IMPRESSION: Patchy bilateral peripheral airspace opacities concerning for COVID pneumonia. Electronically Signed   By: Rolm Baptise M.D.   On: 07/16/2020 19:25        Scheduled Meds: . vitamin C  500 mg Oral Daily  . dexamethasone (DECADRON) injection  6 mg Intravenous Q24H  . enoxaparin (LOVENOX) injection  40 mg Subcutaneous Q24H  . famotidine  20 mg Oral Daily  . zinc sulfate  220 mg Oral Daily   Continuous Infusions: . remdesivir 100 mg in NS 100 mL Stopped (07/17/20 0943)     LOS: 1 day        Hosie Poisson, MD [facility]   To contact the attending provider between 7A-7P or the covering provider during after hours 7P-7A, please log into the web site [email or web address] and access using universal [place] password for that web site. If you do not have the password, please call the hospital operator.  07/17/2020, 3:33 PM

## 2020-07-18 ENCOUNTER — Inpatient Hospital Stay (HOSPITAL_COMMUNITY): Payer: 59

## 2020-07-18 ENCOUNTER — Encounter (HOSPITAL_COMMUNITY): Payer: Self-pay | Admitting: Internal Medicine

## 2020-07-18 DIAGNOSIS — J9601 Acute respiratory failure with hypoxia: Secondary | ICD-10-CM

## 2020-07-18 DIAGNOSIS — J209 Acute bronchitis, unspecified: Secondary | ICD-10-CM

## 2020-07-18 DIAGNOSIS — J440 Chronic obstructive pulmonary disease with acute lower respiratory infection: Secondary | ICD-10-CM

## 2020-07-18 LAB — CBC WITH DIFFERENTIAL/PLATELET
Abs Immature Granulocytes: 0.02 10*3/uL (ref 0.00–0.07)
Basophils Absolute: 0 10*3/uL (ref 0.0–0.1)
Basophils Relative: 0 %
Eosinophils Absolute: 0 10*3/uL (ref 0.0–0.5)
Eosinophils Relative: 0 %
HCT: 33.8 % — ABNORMAL LOW (ref 36.0–46.0)
Hemoglobin: 11.4 g/dL — ABNORMAL LOW (ref 12.0–15.0)
Immature Granulocytes: 1 %
Lymphocytes Relative: 12 %
Lymphs Abs: 0.5 10*3/uL — ABNORMAL LOW (ref 0.7–4.0)
MCH: 29.6 pg (ref 26.0–34.0)
MCHC: 33.7 g/dL (ref 30.0–36.0)
MCV: 87.8 fL (ref 80.0–100.0)
Monocytes Absolute: 0.3 10*3/uL (ref 0.1–1.0)
Monocytes Relative: 7 %
Neutro Abs: 3.6 10*3/uL (ref 1.7–7.7)
Neutrophils Relative %: 80 %
Platelets: 217 10*3/uL (ref 150–400)
RBC: 3.85 MIL/uL — ABNORMAL LOW (ref 3.87–5.11)
RDW: 12.4 % (ref 11.5–15.5)
WBC: 4.4 10*3/uL (ref 4.0–10.5)
nRBC: 0 % (ref 0.0–0.2)

## 2020-07-18 LAB — COMPREHENSIVE METABOLIC PANEL
ALT: 19 U/L (ref 0–44)
AST: 38 U/L (ref 15–41)
Albumin: 2.6 g/dL — ABNORMAL LOW (ref 3.5–5.0)
Alkaline Phosphatase: 45 U/L (ref 38–126)
Anion gap: 9 (ref 5–15)
BUN: 9 mg/dL (ref 6–20)
CO2: 26 mmol/L (ref 22–32)
Calcium: 7.9 mg/dL — ABNORMAL LOW (ref 8.9–10.3)
Chloride: 104 mmol/L (ref 98–111)
Creatinine, Ser: 0.5 mg/dL (ref 0.44–1.00)
GFR, Estimated: >60 mL/min (ref >60)
Glucose, Bld: 143 mg/dL — ABNORMAL HIGH (ref 70–99)
Potassium: 3.2 mmol/L — ABNORMAL LOW (ref 3.5–5.1)
Sodium: 139 mmol/L (ref 135–145)
Total Bilirubin: 0.4 mg/dL (ref 0.3–1.2)
Total Protein: 5.2 g/dL — ABNORMAL LOW (ref 6.5–8.1)

## 2020-07-18 LAB — C-REACTIVE PROTEIN: CRP: 7.9 mg/dL — ABNORMAL HIGH (ref <1.0)

## 2020-07-18 LAB — D-DIMER, QUANTITATIVE: D-Dimer, Quant: 1.54 ug/mL-FEU — ABNORMAL HIGH (ref 0.00–0.50)

## 2020-07-18 LAB — FERRITIN: Ferritin: 1732 ng/mL — ABNORMAL HIGH (ref 11–307)

## 2020-07-18 LAB — VITAMIN D 25 HYDROXY (VIT D DEFICIENCY, FRACTURES): Vit D, 25-Hydroxy: 71.25 ng/mL (ref 30–100)

## 2020-07-18 MED ORDER — DEXAMETHASONE SODIUM PHOSPHATE 10 MG/ML IJ SOLN
8.0000 mg | Freq: Two times a day (BID) | INTRAMUSCULAR | Status: DC
  Administered 2020-07-18 – 2020-07-19 (×3): 8 mg via INTRAVENOUS
  Filled 2020-07-18 (×3): qty 1

## 2020-07-18 MED ORDER — IOHEXOL 350 MG/ML SOLN
100.0000 mL | Freq: Once | INTRAVENOUS | Status: AC | PRN
  Administered 2020-07-18: 50 mL via INTRAVENOUS

## 2020-07-18 MED ORDER — POTASSIUM CHLORIDE CRYS ER 20 MEQ PO TBCR
40.0000 meq | EXTENDED_RELEASE_TABLET | Freq: Two times a day (BID) | ORAL | Status: AC
  Administered 2020-07-18 (×2): 40 meq via ORAL
  Filled 2020-07-18 (×2): qty 2

## 2020-07-18 MED ORDER — BARICITINIB 2 MG PO TABS
4.0000 mg | ORAL_TABLET | Freq: Every day | ORAL | Status: DC
  Administered 2020-07-18 – 2020-07-19 (×2): 4 mg via ORAL
  Filled 2020-07-18 (×2): qty 2

## 2020-07-18 NOTE — Plan of Care (Signed)
  Problem: Education: Goal: Knowledge of General Education information will improve Description: Including pain rating scale, medication(s)/side effects and non-pharmacologic comfort measures Outcome: Progressing   Problem: Health Behavior/Discharge Planning: Goal: Ability to manage health-related needs will improve Outcome: Progressing   Problem: Clinical Measurements: Goal: Ability to maintain clinical measurements within normal limits will improve Outcome: Progressing   Problem: Clinical Measurements: Goal: Diagnostic test results will improve Outcome: Progressing   Problem: Clinical Measurements: Goal: Respiratory complications will improve Outcome: Progressing   Problem: Activity: Goal: Risk for activity intolerance will decrease Outcome: Progressing   Problem: Coping: Goal: Level of anxiety will decrease Outcome: Progressing

## 2020-07-18 NOTE — Progress Notes (Signed)
[facility] PROGRESS NOTE    Progress Note  Ashley [name]  BSJ:[id number] DOB: 1960-10-31 DOA: 07/16/2020 PCP: Hulan Fess, MD     Brief Narrative:   Ashley [name] is an 59 y.o. female past medical history significant for COPD, tobacco abuse of bronchiectasis and ARDS back in 2012 ER with cough and shortness of breath.  She relates her husband was diagnosed with COVID-19 and she has been feeling symptomatic for the last 10 days in the ED was found hypoxic placed on 2 L SARS-CoV-2 PCR was positive.  Assessment/Plan:   Acute respiratory failure with hypoxia secondary to COVID-19 pneumonia/severe sepsis: She is requiring 2 L of oxygen to keep saturations greater than 90%. We will start her empirically on IV steroids, IV remdesivir. She was also started on IV Rocephin and azithromycin has no leukocytosis, procalcitonin is low yield discontinue antibiotics. Start her on baricitinib. Continue to check inflammatory markers daily. Her D-dimer was elevated but less than 5, no need for CT angio at this point. Try to keep prone for Lee 16 hours a day, not prone out of bed to chair continue to use incentive spirometry.  Hypovolemic hyponatremia: Resolved with IV fluid hydration.  Hypokalemia: Replete orally. Recheck in the morning.  Normal anion gap metabolic acidosis: Resolved with IV fluid hydration.  Mild thrombocytopenia: No signs of overt bleeding, likely due to infectious etiology now resolved.  Normocytic anemia: No signs of overt bleeding will need to follow-up with PCP as an outpatient.   DVT prophylaxis: lovenox Family Communication:none Status is: Inpatient  Remains inpatient appropriate because:Hemodynamically unstable   Dispo: The patient is from: Home              Anticipated d/c is to: Home              Anticipated d/c date is: > 3 days              Patient currently is not medically stable to d/c.   Code Status:     Code Status Orders  (From  admission, onward)         Start     Ordered   07/17/20 0008  Full code  Continuous        07/17/20 0008        Code Status History    This patient has a current code status but no historical code status.   Advance Care Planning Activity        IV Access:    Peripheral IV   Procedures and diagnostic studies:   Portable chest 1 View  Result Date: 07/16/2020 CLINICAL DATA:  Shortness of breath, COVID exposure EXAM: PORTABLE CHEST 1 VIEW COMPARISON:  02/10/2016 FINDINGS: Patchy bilateral peripheral airspace opacities noted in the mid and lower lungs bilaterally. Heart is normal size. No effusions or pneumothorax. No acute bony abnormality. IMPRESSION: Patchy bilateral peripheral airspace opacities concerning for COVID pneumonia. Electronically Signed   By: Rolm Baptise M.D.   On: 07/16/2020 19:25     Medical Consultants:    None.  Anti-Infectives:   remdesivir  Subjective:    Ashley [name] she relates her breathing is about the same as it was yesterday still a lot of coughing and short of breath  Objective:    Vitals:   07/17/20 1243 07/17/20 2105 07/18/20 0348 07/18/20 0500  BP: (!) 106/58 (!) 100/54 (!) 102/55   Pulse: 67 72 66   Resp: 18 14 17    Temp: 98 F (36.7 C) (!)  97.3 F (36.3 C) (!) 97.5 F (36.4 C)   TempSrc: Oral Oral Oral   SpO2: 96% 91% 90%   Weight:    53.1 kg  Height:       SpO2: 90 % O2 Flow Rate (L/min): 2 L/min   Intake/Output Summary (Last 24 hours) at 07/18/2020 0704 Last data filed at 07/17/2020 1808 Gross per 24 hour  Intake 875.63 ml  Output 700 ml  Net 175.63 ml   Filed Weights   07/16/20 1841 07/17/20 0457 07/18/20 0500  Weight: 47.6 kg 51.3 kg 53.1 kg    Exam: General exam: In no acute distress. Respiratory system: Good air movement and clear to auscultation. Cardiovascular system: S1 & S2 heard, RRR. No JVD. Gastrointestinal system: Abdomen is nondistended, soft and nontender.  Extremities: No pedal  edema. Skin: No rashes, lesions or ulcers Psychiatry: Judgement and insight appear normal. Mood & affect appropriate.    Data Reviewed:    Labs: Basic Metabolic Panel: Recent Labs  Lab 07/16/20 1927 07/16/20 1927 07/17/20 0118 07/18/20 0443  NA 131*  --  138 139  K 3.4*   < > 3.7 3.2*  CL 103  --  107 104  CO2 18*  --  21* 26  GLUCOSE 114*  --  168* 143*  BUN 5*  --  <5* 9  CREATININE 0.58  --  0.64  0.62 0.50  CALCIUM 6.8*  --  7.6* 7.9*   < > = values in this interval not displayed.   GFR Estimated Creatinine Clearance: 59.1 mL/min (by C-G formula based on SCr of 0.5 mg/dL). Liver Function Tests: Recent Labs  Lab 07/16/20 1927 07/17/20 0118 07/18/20 0443  AST 47* 48* 38  ALT 19 21 19   ALKPHOS 41 47 45  BILITOT 0.6 0.4 0.4  PROT 5.1* 5.6* 5.2*  ALBUMIN 2.6* 2.9* 2.6*   No results for input(s): LIPASE, AMYLASE in the last 168 hours. No results for input(s): AMMONIA in the last 168 hours. Coagulation profile Recent Labs  Lab 07/17/20 0118  INR 1.1   COVID-19 Labs  Recent Labs    07/16/20 1927 07/17/20 1553 07/18/20 0443  DDIMER  --  2.15* 1.54*  FERRITIN 1,561*  --  1,732*  LDH 412*  --   --   CRP 10.9*  --  7.9*    Lab Results  Component Value Date   SARSCOV2NAA POSITIVE (A) 07/16/2020    CBC: Recent Labs  Lab 07/16/20 1927 07/17/20 0118 07/18/20 0443  WBC 3.8* 2.8* 4.4  NEUTROABS 3.4  --  3.6  HGB 11.9* 12.2 11.4*  HCT 35.3* 36.9 33.8*  MCV 87.8 90.4 87.8  PLT 131* 148* 217   Cardiac Enzymes: No results for input(s): CKTOTAL, CKMB, CKMBINDEX, TROPONINI in the last 168 hours. BNP (last 3 results) No results for input(s): PROBNP in the last 8760 hours. CBG: No results for input(s): GLUCAP in the last 168 hours. D-Dimer: Recent Labs    07/17/20 1553 07/18/20 0443  DDIMER 2.15* 1.54*   Hgb A1c: No results for input(s): HGBA1C in the last 72 hours. Lipid Profile: No results for input(s): CHOL, HDL, LDLCALC, TRIG, CHOLHDL,  LDLDIRECT in the last 72 hours. Thyroid function studies: No results for input(s): TSH, T4TOTAL, T3FREE, THYROIDAB in the last 72 hours.  Invalid input(s): FREET3 Anemia work up: Recent Labs    07/16/20 1927 07/18/20 0443  FERRITIN 1,561* 1,732*   Sepsis Labs: Recent Labs  Lab 07/16/20 1927 07/17/20 0118 07/18/20 0443  PROCALCITON <0.10  0.11  --   WBC 3.8* 2.8* 4.4   Microbiology Recent Results (from the past 240 hour(s))  Resp Panel by RT-PCR (Flu A&B, Covid) Nasopharyngeal Swab     Status: Abnormal   Collection Time: 07/16/20  7:27 PM   Specimen: Nasopharyngeal Swab; Nasopharyngeal(NP) swabs in vial transport medium  Result Value Ref Range Status   SARS Coronavirus 2 by RT PCR POSITIVE (A) NEGATIVE Final    Comment: RESULT CALLED TO, READ BACK BY AND VERIFIED WITH: CORALDE H. 11.19.21 @ 0216 BY MECIAL J. (NOTE) SARS-CoV-2 target nucleic acids are DETECTED.  The SARS-CoV-2 RNA is generally detectable in upper respiratory specimens during the acute phase of infection. Positive results are indicative of the presence of the identified virus, but do not rule out bacterial infection or co-infection with other pathogens not detected by the test. Clinical correlation with patient history and other diagnostic information is necessary to determine patient infection status. The expected result is Negative.  Fact Sheet for Patients: EntrepreneurPulse.com.au  Fact Sheet for Healthcare Providers: IncredibleEmployment.be  This test is not yet approved or cleared by the [place] FDA and  has been authorized for detection and/or diagnosis of SARS-CoV-2 by FDA under an Emergency Use Authorization (EUA).  This EUA will remain in effect (meaning this test  can be used) for the duration of  the COVID-19 declaration under Section 564(b)(1) of the Act, 21 U.S.C. section 360bbb-3(b)(1), unless the authorization is terminated or revoked  sooner.     Influenza A by PCR NEGATIVE NEGATIVE Final   Influenza B by PCR NEGATIVE NEGATIVE Final    Comment: (NOTE) The Xpert Xpress SARS-CoV-2/FLU/RSV plus assay is intended as an aid in the diagnosis of influenza from Nasopharyngeal swab specimens and should not be used as a sole basis for treatment. Nasal washings and aspirates are unacceptable for Xpert Xpress SARS-CoV-2/FLU/RSV testing.  Fact Sheet for Patients: EntrepreneurPulse.com.au  Fact Sheet for Healthcare Providers: IncredibleEmployment.be  This test is not yet approved or cleared by the [place] FDA and has been authorized for detection and/or diagnosis of SARS-CoV-2 by FDA under an Emergency Use Authorization (EUA). This EUA will remain in effect (meaning this test can be used) for the duration of the COVID-19 declaration under Section 564(b)(1) of the Act, 21 U.S.C. section 360bbb-3(b)(1), unless the authorization is terminated or revoked.  Performed at [facility], [place] [address] [place], [place] [address]      Medications:   . vitamin C  500 mg Oral Daily  . dexamethasone (DECADRON) injection  6 mg Intravenous Q24H  . enoxaparin (LOVENOX) injection  40 mg Subcutaneous Q24H  . famotidine  20 mg Oral Daily  . feeding supplement  237 mL Oral BID BM  . multivitamin with minerals  1 tablet Oral Daily  . zinc sulfate  220 mg Oral Daily   Continuous Infusions: . remdesivir 100 mg in NS 100 mL Stopped (07/17/20 0943)      LOS: 2 days   Charlynne Cousins  [facility]  07/18/2020, 7:04 AM

## 2020-07-19 DIAGNOSIS — D696 Thrombocytopenia, unspecified: Secondary | ICD-10-CM

## 2020-07-19 DIAGNOSIS — F17200 Nicotine dependence, unspecified, uncomplicated: Secondary | ICD-10-CM

## 2020-07-19 LAB — COMPREHENSIVE METABOLIC PANEL
ALT: 20 U/L (ref 0–44)
AST: 37 U/L (ref 15–41)
Albumin: 2.8 g/dL — ABNORMAL LOW (ref 3.5–5.0)
Alkaline Phosphatase: 44 U/L (ref 38–126)
Anion gap: 9 (ref 5–15)
BUN: 11 mg/dL (ref 6–20)
CO2: 27 mmol/L (ref 22–32)
Calcium: 8.3 mg/dL — ABNORMAL LOW (ref 8.9–10.3)
Chloride: 106 mmol/L (ref 98–111)
Creatinine, Ser: 0.52 mg/dL (ref 0.44–1.00)
GFR, Estimated: >60 mL/min (ref >60)
Glucose, Bld: 135 mg/dL — ABNORMAL HIGH (ref 70–99)
Potassium: 4.5 mmol/L (ref 3.5–5.1)
Sodium: 142 mmol/L (ref 135–145)
Total Bilirubin: 0.6 mg/dL (ref 0.3–1.2)
Total Protein: 5.6 g/dL — ABNORMAL LOW (ref 6.5–8.1)

## 2020-07-19 LAB — CBC WITH DIFFERENTIAL/PLATELET
Abs Immature Granulocytes: 0.06 10*3/uL (ref 0.00–0.07)
Basophils Absolute: 0 10*3/uL (ref 0.0–0.1)
Basophils Relative: 0 %
Eosinophils Absolute: 0 10*3/uL (ref 0.0–0.5)
Eosinophils Relative: 0 %
HCT: 36.6 % (ref 36.0–46.0)
Hemoglobin: 12.3 g/dL (ref 12.0–15.0)
Immature Granulocytes: 1 %
Lymphocytes Relative: 15 %
Lymphs Abs: 0.9 10*3/uL (ref 0.7–4.0)
MCH: 29.9 pg (ref 26.0–34.0)
MCHC: 33.6 g/dL (ref 30.0–36.0)
MCV: 89.1 fL (ref 80.0–100.0)
Monocytes Absolute: 0.3 10*3/uL (ref 0.1–1.0)
Monocytes Relative: 5 %
Neutro Abs: 4.6 10*3/uL (ref 1.7–7.7)
Neutrophils Relative %: 79 %
Platelets: 286 10*3/uL (ref 150–400)
RBC: 4.11 MIL/uL (ref 3.87–5.11)
RDW: 12.7 % (ref 11.5–15.5)
WBC: 5.8 10*3/uL (ref 4.0–10.5)
nRBC: 0 % (ref 0.0–0.2)

## 2020-07-19 LAB — D-DIMER, QUANTITATIVE: D-Dimer, Quant: 0.98 ug/mL-FEU — ABNORMAL HIGH (ref 0.00–0.50)

## 2020-07-19 LAB — C-REACTIVE PROTEIN: CRP: 4.1 mg/dL — ABNORMAL HIGH (ref <1.0)

## 2020-07-19 LAB — FERRITIN: Ferritin: 1678 ng/mL — ABNORMAL HIGH (ref 11–307)

## 2020-07-19 MED ORDER — DEXAMETHASONE SODIUM PHOSPHATE 10 MG/ML IJ SOLN
6.0000 mg | INTRAMUSCULAR | Status: DC
  Administered 2020-07-20: 6 mg via INTRAVENOUS
  Filled 2020-07-19: qty 1

## 2020-07-19 MED ORDER — ALPRAZOLAM 0.5 MG PO TABS
0.5000 mg | ORAL_TABLET | Freq: Once | ORAL | Status: AC
  Administered 2020-07-19: 0.5 mg via ORAL
  Filled 2020-07-19: qty 1

## 2020-07-19 NOTE — Progress Notes (Signed)
PROGRESS NOTE  Ashley [name]  [name] DOB: 02/14/1961 DOA: 07/16/2020 PCP: Hulan Fess, MD   Brief Narrative: Ashley [name] is a 59 y.o. 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 She was tachypneic and hypoxic requiring 2L O2. SARS-CoV-2 PCR confirmed positivity, CXR demonstrated bilateral infiltrates. CRP elevated to 10.9, PCT negative. Empiric antibiotics, remdesivir, and steroids were started on admission for covid-19 pneumonia. D-dimer was elevated, CTA chest showed no PE.   Assessment & Plan: Principal Problem:   Severe sepsis ([place]) Active Problems:   SMOKER   COPD with acute bronchitis (HCC)   Hypotension   Hyponatremia   Thrombocytopenia (HCC)   Hypokalemia   Suspected COVID-19 virus infection  Acute hypoxemic respiratory failure due to covid-19 pneumonia: SARS-CoV-2 PCR positive on 11/18.  - Wean oxygen as tolerated, currently only on 2L O2. Preexisting pulmonary disease may require longer duration of low level oxygen supplementation during recovery period. - Continue remdesivir x5 days (11/18 - 11/22) - Continue steroids. CRP improving.  - Baricitinib was started 11/20. Hypoxia is mild, so this will be stopped for now. - Encourage OOB, IS, FV, and awake proning if able - Continue airborne, contact precautions for 21 days from positive testing. - Monitor CMP and inflammatory markers - Enoxaparin prophylactic dose.   COPD:  - Continue BDs.  Severe sepsis: POA, due to pneumonia, resolving.   PTSD, anxiety: Related to prior ARDS hospitalization.  - Reassurance provided, taper steroids - Continue benzodiazepine - Will minimize hospitalization duration as able  Thrombocytopenia: Resolved.  Hypokalemia: Supplemented.   Hypovolemic hyponatremia: Resolved with IVF  NAGMA: Resolved  DVT prophylaxis:  Lovenox Code Status: Full Family Communication: None at bedside Disposition Plan:  Status is: Inpatient  Remains inpatient appropriate because:Inpatient level of care appropriate due to severity of illness  Dispo: The patient is from: Home              Anticipated d/c is to: Home              Anticipated d/c date is: 1 day              Patient currently is not medically stable to d/c.  Consultants:   None  Procedures:   None  Antimicrobials:  Remdesivir   Subjective: Shortness of breath is improved, worse with exertion, still moderate-severely weak. No new complaints, though feeling anxious and wanting to go home.  Objective: Vitals:   07/19/20 0404 07/19/20 0500 07/19/20 1119 07/19/20 1404  BP: (!) 97/37  (!) 96/58 104/60  Pulse: (!) 52  (!) 57 (!) 58  Resp: 16   16  Temp: (!) 97.3 F (36.3 C)   (!) 97.5 F (36.4 C)  TempSrc: Oral   Oral  SpO2: 90%  91% 95%  Weight:  52 kg    Height:        Intake/Output Summary (Last 24 hours) at 07/19/2020 1458 Last data filed at 07/19/2020 0915 Gross per 24 hour  Intake 480 ml  Output -  Net 480 ml   Filed Weights   07/17/20 0457 07/18/20 0500 07/19/20 0500  Weight: 51.3 kg 53.1 kg 52 kg    Gen: Thin female in no distress Pulm: Non-labored breathing 2L O2, crackles bilaterally.  CV: Regular rate and rhythm. No murmur, rub, or gallop. No JVD, no pedal edema. GI: Abdomen soft, non-tender, non-distended,  with normoactive bowel sounds. No organomegaly or masses felt. Ext: Warm, no deformities Skin: No rashes, lesions or ulcers Neuro: Alert and oriented. No focal neurological deficits. Psych: Judgement and insight appear normal. Mood & affect appropriate.   Data Reviewed: I have personally reviewed following labs and imaging studies  CBC: Recent Labs  Lab 07/16/20 1927 07/17/20 0118 07/18/20 0443 07/19/20 0414  WBC 3.8* 2.8* 4.4 5.8  NEUTROABS 3.4  --  3.6 4.6  HGB 11.9* 12.2 11.4* 12.3  HCT 35.3* 36.9 33.8*  36.6  MCV 87.8 90.4 87.8 89.1  PLT 131* 148* 217 379   Basic Metabolic Panel: Recent Labs  Lab 07/16/20 1927 07/17/20 0118 07/18/20 0443 07/19/20 0414  NA 131* 138 139 142  K 3.4* 3.7 3.2* 4.5  CL 103 107 104 106  CO2 18* 21* 26 27  GLUCOSE 114* 168* 143* 135*  BUN 5* <5* 9 11  CREATININE 0.58 0.64  0.62 0.50 0.52  CALCIUM 6.8* 7.6* 7.9* 8.3*   GFR: Estimated Creatinine Clearance: 59.1 mL/min (by C-G formula based on SCr of 0.52 mg/dL). Liver Function Tests: Recent Labs  Lab 07/16/20 1927 07/17/20 0118 07/18/20 0443 07/19/20 0414  AST 47* 48* 38 37  ALT 19 21 19 20   ALKPHOS 41 47 45 44  BILITOT 0.6 0.4 0.4 0.6  PROT 5.1* 5.6* 5.2* 5.6*  ALBUMIN 2.6* 2.9* 2.6* 2.8*   No results for input(s): LIPASE, AMYLASE in the last 168 hours. No results for input(s): AMMONIA in the last 168 hours. Coagulation Profile: Recent Labs  Lab 07/17/20 0118  INR 1.1   Cardiac Enzymes: No results for input(s): CKTOTAL, CKMB, CKMBINDEX, TROPONINI in the last 168 hours. BNP (last 3 results) No results for input(s): PROBNP in the last 8760 hours. HbA1C: No results for input(s): HGBA1C in the last 72 hours. CBG: No results for input(s): GLUCAP in the last 168 hours. Lipid Profile: No results for input(s): CHOL, HDL, LDLCALC, TRIG, CHOLHDL, LDLDIRECT in the last 72 hours. Thyroid Function Tests: No results for input(s): TSH, T4TOTAL, FREET4, T3FREE, THYROIDAB in the last 72 hours. Anemia Panel: Recent Labs    07/18/20 0443 07/19/20 0414  FERRITIN 1,732* 1,678*   Urine analysis: No results found for: COLORURINE, APPEARANCEUR, LABSPEC, PHURINE, GLUCOSEU, HGBUR, BILIRUBINUR, KETONESUR, PROTEINUR, UROBILINOGEN, NITRITE, LEUKOCYTESUR Recent Results (from the past 240 hour(s))  Resp Panel by RT-PCR (Flu A&B, Covid) Nasopharyngeal Swab     Status: Abnormal   Collection Time: 07/16/20  7:27 PM   Specimen: Nasopharyngeal Swab; Nasopharyngeal(NP) swabs in vial transport medium  Result  Value Ref Range Status   SARS Coronavirus 2 by RT PCR POSITIVE (A) NEGATIVE Final    Comment: RESULT CALLED TO, READ BACK BY AND VERIFIED WITH: CORALDE H. 11.19.21 @ 0216 BY MECIAL J. (NOTE) SARS-CoV-2 target nucleic acids are DETECTED.  The SARS-CoV-2 RNA is generally detectable in upper respiratory specimens during the acute phase of infection. Positive results are indicative of the presence of the identified virus, but do not rule out bacterial infection or co-infection with other pathogens not detected by the test. Clinical correlation with patient history and other diagnostic information is necessary to determine patient infection status. The expected result is Negative.  Fact Sheet for Patients: EntrepreneurPulse.com.au  Fact Sheet for Healthcare Providers: IncredibleEmployment.be  This test is not yet approved or cleared by the [place] FDA and  has been authorized for detection and/or diagnosis of SARS-CoV-2 by FDA under an Emergency Use Authorization (EUA).  This EUA will remain  in effect (meaning this test  can be used) for the duration of  the COVID-19 declaration under Section 564(b)(1) of the Act, 21 U.S.C. section 360bbb-3(b)(1), unless the authorization is terminated or revoked sooner.     Influenza A by PCR NEGATIVE NEGATIVE Final   Influenza B by PCR NEGATIVE NEGATIVE Final    Comment: (NOTE) The Xpert Xpress SARS-CoV-2/FLU/RSV plus assay is intended as an aid in the diagnosis of influenza from Nasopharyngeal swab specimens and should not be used as a sole basis for treatment. Nasal washings and aspirates are unacceptable for Xpert Xpress SARS-CoV-2/FLU/RSV testing.  Fact Sheet for Patients: EntrepreneurPulse.com.au  Fact Sheet for Healthcare Providers: IncredibleEmployment.be  This test is not yet approved or cleared by the [place] FDA and has been authorized for detection  and/or diagnosis of SARS-CoV-2 by FDA under an Emergency Use Authorization (EUA). This EUA will remain in effect (meaning this test can be used) for the duration of the COVID-19 declaration under Section 564(b)(1) of the Act, 21 U.S.C. section 360bbb-3(b)(1), unless the authorization is terminated or revoked.  Performed at [facility], [place] [address] [place], [place] [address]       Radiology Studies: CT ANGIO CHEST PE W OR WO CONTRAST  Result Date: 07/18/2020 CLINICAL DATA:  59 year old female with acute cough and shortness of breath. COVID positive. EXAM: CT ANGIOGRAPHY CHEST WITH CONTRAST TECHNIQUE: Multidetector CT imaging of the chest was performed using the standard protocol during bolus administration of intravenous contrast. Multiplanar CT image reconstructions and MIPs were obtained to evaluate the vascular anatomy. CONTRAST:  66mL OMNIPAQUE IOHEXOL 350 MG/ML SOLN COMPARISON:  07/16/2020 chest radiograph and 12/11/2012 CT FINDINGS: Cardiovascular: This is a technically satisfactory study. No pulmonary emboli are identified. The heart size is normal. There is no evidence of thoracic aortic aneurysm or pericardial effusion. Mediastinum/Nodes: No enlarged mediastinal, hilar, or axillary lymph nodes. Thyroid gland, trachea, and esophagus demonstrate no significant findings. Lungs/Pleura: Diffuse primarily peripheral bilateral ground-glass/airspace opacities are noted, compatible with infection. No discrete nodule, mass, consolidation, pleural effusion or pneumothorax noted. Upper Abdomen: No acute abnormality. Musculoskeletal: No acute or suspicious bony abnormalities noted. Review of the MIP images confirms the above findings. IMPRESSION: 1. Diffuse bilateral ground-glass/airspace opacities compatible with infection/COVID changes. 2. No evidence of pulmonary emboli, pleural effusion or pneumothorax. Electronically Signed   By: Margarette Canada M.D.   On: 07/18/2020 11:11     Scheduled Meds: . vitamin C  500 mg Oral Daily  . baricitinib  4 mg Oral Daily  . dexamethasone (DECADRON) injection  8 mg Intravenous Q12H  . enoxaparin (LOVENOX) injection  40 mg Subcutaneous Q24H  . famotidine  20 mg Oral Daily  . feeding supplement  237 mL Oral BID BM  . multivitamin with minerals  1 tablet Oral Daily  . zinc sulfate  220 mg Oral Daily   Continuous Infusions: . remdesivir 100 mg in NS 100 mL 100 mg (07/19/20 1009)     LOS: 3 days   Time spent: 35 minutes.  Patrecia Pour, MD [facility] [email or web address] 07/19/2020, 2:58 PM

## 2020-07-19 NOTE — Plan of Care (Signed)

## 2020-07-20 LAB — C-REACTIVE PROTEIN: CRP: 2.3 mg/dL — ABNORMAL HIGH (ref <1.0)

## 2020-07-20 MED ORDER — DEXAMETHASONE 6 MG PO TABS: 6.0000 mg | ORAL_TABLET | Freq: Every day | ORAL | 0 refills | Status: AC

## 2020-07-20 NOTE — TOC Transition Note (Signed)
Transition of Care [facility]) - CM/SW Discharge Note   Patient Details  Name: Ashley [name] MRN: [id number] Date of Birth: [birth year]  [facility] CM/SW Contact:  Trish Mage, LCSW Phone Number: 07/20/2020, 12:12 PM   Clinical Narrative:   Patient who is scheduled for d/c today is in need of home O2, has a ride.  SAT note and orders seen and appreciated.  Zach with [employer] will arranged for delivery of travel unit to patient's room, home unit to home. No further needs identified.  TOC sign off.    Final next level of care: Home/Self Care Barriers to Discharge: No Barriers Identified   Patient Goals and CMS Choice        Discharge Placement                       Discharge Plan and Services                                     Social Determinants of Health (SDOH) Interventions     Readmission Risk Interventions No flowsheet data found.

## 2020-07-20 NOTE — Progress Notes (Signed)
SATURATION QUALIFICATIONS: (This note is used to comply with regulatory documentation for home oxygen)  Patient Saturations on Room Air at Rest = 95%  Patient Saturations on Room Air while Ambulating =79%  Patient Saturations on 1Liters of oxygen while Ambulating =91%  Please briefly explain why patient needs home oxygen:COVID Diagnosis; falls below therapeutic level when ambulating.

## 2020-07-20 NOTE — Discharge Summary (Signed)
Physician Discharge Summary  Ashley [name] IWO:[id number] DOB: 06-Apr-1961 DOA: 07/16/2020  PCP: Hulan Fess, MD  Admit date: 07/16/2020 Discharge date: 07/20/2020  Admitted From: Home Disposition: Home   Recommendations for Outpatient Follow-up:  1. Follow up with PCP by telehealth in the next week and/or after covid isolation period ends (21 days from positive testing) to reassess ambulatory pulse oximetry.   Home Health: None Equipment/Devices: 1L O2 Discharge Condition: Stable CODE STATUS: Full Diet recommendation: Regular  Brief/Interim Summary: Ashley [name] is a 59 y.o. 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 She was tachypneic and hypoxic requiring 2L O2. SARS-CoV-2 PCR confirmed positivity, CXR demonstrated bilateral infiltrates. CRP elevated to 10.9, PCT negative. Empiric antibiotics, remdesivir, and steroids were started on admission for covid-19 pneumonia. D-dimer was elevated, CTA chest showed no PE. With treatment, hypoxia has improved, inflammatory markers have sustained downtrend, and the patient's respiratory effort has normalized. Due to PTSD related to prior ARDS admission and overall clinical stability, she is discharged 07/20/2020 with supplemental oxygen to complete a course of decadron as an outpatient.  Discharge Diagnoses:  Principal Problem:   Severe sepsis ([place]) Active Problems:   SMOKER   COPD with acute bronchitis (HCC)   Hypotension   Hyponatremia   Thrombocytopenia (HCC)   Hypokalemia   Suspected COVID-19 virus infection  Acute hypoxemic respiratory failure due to covid-19 pneumonia: SARS-CoV-2 PCR positive on 11/18.  - Will continue 1L O2 by nasal cannula at discharge.   - Completed remdesivir x5 days (11/18 - 11/22) on day of discharge - Continue steroids. CRP is sustaining  improvement 10.9 > 7.9 > 4.1 > 2.3. - Baricitinib was started 11/20 and subsequently stopped due to such mild hypoxia.  - Continue airborne, contact precautions for 21 days from positive testing.  COPD:  - Continue BDs.  Severe sepsis: POA, due to pneumonia, resolving.   PTSD, anxiety: Related to prior ARDS hospitalization.  - Reassurance provided, tapered steroids but will continue since still hypoxic. - Continue benzodiazepine - Will minimize hospitalization duration as able  Thrombocytopenia: Resolved.  Hypokalemia: Supplemented.   Hypovolemic hyponatremia: Resolved with IVF  NAGMA: Resolved  Discharge Instructions Discharge Instructions    Discharge instructions   Complete by: As directed    You are being discharged from the hospital after treatment for covid-19 infection. You are felt to be stable enough to no longer require inpatient monitoring, testing, and treatment, though you will need to follow the recommendations below: - Continue taking decadron (steroid) for 5 more days - Continue using supplemental oxygen at home until you follow up with your PCP - Per CDC guidelines, you will need to remain in isolation for 21 days from your first positive covid test. - Follow up with your doctor in the next week via telehealth or seek medical attention right away if your symptoms get WORSE.  - You are still encouraged to get a covid vaccination between 21 days (after isolation period ends) and 90 days (before immunity is thought to wear off).  Directions for you at home:  Wear a facemask You should wear a facemask that covers your nose and mouth when you are in the same room with other people and when you visit a healthcare provider. People who live with or visit you should also wear a facemask while they are in the same room with you.  Separate yourself from other people in your home As much as possible, you should stay in a different room from other people in your  home. Also, you should use a separate bathroom, if available.  Avoid sharing household items You should not share dishes, drinking glasses, cups, eating utensils, towels, bedding, or other items with other people in your home. After using these items, you should wash them thoroughly with soap and water.  Cover your coughs and sneezes Cover your mouth and nose with a tissue when you cough or sneeze, or you can cough or sneeze into your sleeve. Throw used tissues in a lined trash can, and immediately wash your hands with soap and water for at least 20 seconds or use an alcohol-based hand rub.  Wash your [facility] your hands often and thoroughly with soap and water for at least 20 seconds. You can use an alcohol-based hand sanitizer if soap and water are not available and if your hands are not visibly dirty. Avoid touching your eyes, nose, and mouth with unwashed hands.  Directions for those who live with, or provide care at home for you:  Limit the number of people who have contact with the patient If possible, have only one caregiver for the patient. Other household members should stay in another home or place of residence. If this is not possible, they should stay in another room, or be separated from the patient as much as possible. Use a separate bathroom, if available. Restrict visitors who do not have an essential need to be in the home.  Ensure good ventilation Make sure that shared spaces in the home have good air flow, such as from an air conditioner or an opened window, weather permitting.  Wash your hands often Wash your hands often and thoroughly with soap and water for at least 20 seconds. You can use an alcohol based hand sanitizer if soap and water are not available and if your hands are not visibly dirty. Avoid touching your eyes, nose, and mouth with unwashed hands. Use disposable paper towels to dry your hands. If not available, use dedicated cloth towels and replace  them when they become wet.  Wear a facemask and gloves Wear a disposable facemask at all times in the room and gloves when you touch or have contact with the patient's blood, body fluids, and/or secretions or excretions, such as sweat, saliva, sputum, nasal mucus, vomit, urine, or feces.  Ensure the mask fits over your nose and mouth tightly, and do not touch it during use. Throw out disposable facemasks and gloves after using them. Do not reuse. Wash your hands immediately after removing your facemask and gloves. If your personal clothing becomes contaminated, carefully remove clothing and launder. Wash your hands after handling contaminated clothing. Place all used disposable facemasks, gloves, and other waste in a lined container before disposing them with other household waste. Remove gloves and wash your hands immediately after handling these items.  Do not share dishes, glasses, or other household items with the patient Avoid sharing household items. You should not share dishes, drinking glasses, cups, eating utensils, towels, bedding, or other items with a patient who is confirmed to have, or being evaluated for, COVID-19 infection. After the person uses these items, you should wash them thoroughly with soap and water.  Wash laundry thoroughly Immediately remove and wash clothes or bedding that have blood, body fluids, and/or secretions or excretions, such as sweat, saliva, sputum, nasal mucus, vomit, urine, or feces, on  them. Wear gloves when handling laundry from the patient. Read and follow directions on labels of laundry or clothing items and detergent. In general, wash and dry with the warmest temperatures recommended on the label.  Clean all areas the individual has used often Clean all touchable surfaces, such as counters, tabletops, doorknobs, bathroom fixtures, toilets, phones, keyboards, tablets, and bedside tables, every day. Also, clean any surfaces that may have blood, body  fluids, and/or secretions or excretions on them. Wear gloves when cleaning surfaces the patient has come in contact with. Use a diluted bleach solution (e.g., dilute bleach with 1 part bleach and 10 parts water) or a household disinfectant with a label that says EPA-registered for coronaviruses. To make a bleach solution at home, add 1 tablespoon of bleach to 1 quart (4 cups) of water. For a larger supply, add  cup of bleach to 1 gallon (16 cups) of water. Read labels of cleaning products and follow recommendations provided on product labels. Labels contain instructions for safe and effective use of the cleaning product including precautions you should take when applying the product, such as wearing gloves or eye protection and making sure you have good ventilation during use of the product. Remove gloves and wash hands immediately after cleaning.  Monitor yourself for signs and symptoms of illness Caregivers and household members are considered close contacts, should monitor their health, and will be asked to limit movement outside of the home to the extent possible. Follow the monitoring steps for close contacts listed on the symptom monitoring form.  If you have additional questions, contact your local health department or call the epidemiologist on call at [phone] (available 24/7). This guidance is subject to change. For the most up-to-date guidance from [facility], please refer to their website: [email or web address]   MyChart COVID-19 home monitoring program   Complete by: Jul 20, 2020    Is the patient willing to use the [place] for home monitoring?: Yes     Allergies as of 07/20/2020   No Known Allergies     Medication List    TAKE these medications   ALPRAZolam 0.5 MG tablet Commonly known as: XANAX Take 0.5 mg by mouth 2 (two) times daily as needed for anxiety.   dexamethasone 6 MG tablet Commonly known as:  Decadron Take 1 tablet (6 mg total) by mouth daily for 5 days. Start taking on: July 21, 2020   traZODone 100 MG tablet Commonly known as: DESYREL Take 100 mg by mouth at bedtime as needed for sleep.   vitamin C 1000 MG tablet Take 1,000 mg by mouth daily.            Durable Medical Equipment  (From admission, onward)         Start     Ordered   07/20/20 1003  For home use only DME oxygen  Once       Question Answer Comment  Length of Need 6 Months   Mode or (Route) Nasal cannula   Liters per Minute 2   Frequency Continuous (stationary and portable oxygen unit needed)   Oxygen delivery system Gas      07/20/20 1002          Follow-up Information    Little, Lennette Bihari, MD. Schedule an appointment as soon as possible for a visit.   Specialty: Family Medicine Contact information: [place] [address] [phone]              No Known  Allergies  Consultations:  None  Procedures/Studies: CT ANGIO CHEST PE W OR WO CONTRAST  Result Date: 07/18/2020 CLINICAL DATA:  59 year old female with acute cough and shortness of breath. COVID positive. EXAM: CT ANGIOGRAPHY CHEST WITH CONTRAST TECHNIQUE: Multidetector CT imaging of the chest was performed using the standard protocol during bolus administration of intravenous contrast. Multiplanar CT image reconstructions and MIPs were obtained to evaluate the vascular anatomy. CONTRAST:  14mL OMNIPAQUE IOHEXOL 350 MG/ML SOLN COMPARISON:  07/16/2020 chest radiograph and 12/11/2012 CT FINDINGS: Cardiovascular: This is a technically satisfactory study. No pulmonary emboli are identified. The heart size is normal. There is no evidence of thoracic aortic aneurysm or pericardial effusion. Mediastinum/Nodes: No enlarged mediastinal, hilar, or axillary lymph nodes. Thyroid gland, trachea, and esophagus demonstrate no significant findings. Lungs/Pleura: Diffuse primarily peripheral bilateral ground-glass/airspace  opacities are noted, compatible with infection. No discrete nodule, mass, consolidation, pleural effusion or pneumothorax noted. Upper Abdomen: No acute abnormality. Musculoskeletal: No acute or suspicious bony abnormalities noted. Review of the MIP images confirms the above findings. IMPRESSION: 1. Diffuse bilateral ground-glass/airspace opacities compatible with infection/COVID changes. 2. No evidence of pulmonary emboli, pleural effusion or pneumothorax. Electronically Signed   By: Margarette Canada M.D.   On: 07/18/2020 11:11   Portable chest 1 View  Result Date: 07/16/2020 CLINICAL DATA:  Shortness of breath, COVID exposure EXAM: PORTABLE CHEST 1 VIEW COMPARISON:  02/10/2016 FINDINGS: Patchy bilateral peripheral airspace opacities noted in the mid and lower lungs bilaterally. Heart is normal size. No effusions or pneumothorax. No acute bony abnormality. IMPRESSION: Patchy bilateral peripheral airspace opacities concerning for COVID pneumonia. Electronically Signed   By: Rolm Baptise M.D.   On: 07/16/2020 19:25      Subjective: Experiencing escalating anxiety, panic over being in the hospital despite multiple extra benzodiazepine doses over past 24 hours. Breathing is much much better, minimal cough, able to get around and declines PT assessment, just wants to go home ASAP. No chest pain, bleeding, leg swelling. Other than anxiety, feels much better.   Discharge Exam: Vitals:   07/19/20 2021 07/20/20 0424  BP: (!) 103/51 (!) 108/58  Pulse: 67 (!) 56  Resp: 17 15  Temp: 97.9 F (36.6 C) 98 F (36.7 C)  SpO2: 91% 90%   General: Pt is alert, awake, not in acute distress Cardiovascular: RRR, S1/S2 +, no rubs, no gallops Respiratory: Nonlabored, mild crackles bilaterally without wheezes. On 1L O2.  Abdominal: Soft, NT, ND, bowel sounds + Extremities: No edema, no cyanosis  Labs: BNP (last 3 results) No results for input(s): BNP in the last 8760 hours. Basic Metabolic Panel: Recent Labs   Lab 07/16/20 1927 07/17/20 0118 07/18/20 0443 07/19/20 0414  NA 131* 138 139 142  K 3.4* 3.7 3.2* 4.5  CL 103 107 104 106  CO2 18* 21* 26 27  GLUCOSE 114* 168* 143* 135*  BUN 5* <5* 9 11  CREATININE 0.58 0.64  0.62 0.50 0.52  CALCIUM 6.8* 7.6* 7.9* 8.3*   Liver Function Tests: Recent Labs  Lab 07/16/20 1927 07/17/20 0118 07/18/20 0443 07/19/20 0414  AST 47* 48* 38 37  ALT 19 21 19 20   ALKPHOS 41 47 45 44  BILITOT 0.6 0.4 0.4 0.6  PROT 5.1* 5.6* 5.2* 5.6*  ALBUMIN 2.6* 2.9* 2.6* 2.8*   No results for input(s): LIPASE, AMYLASE in the last 168 hours. No results for input(s): AMMONIA in the last 168 hours. CBC: Recent Labs  Lab 07/16/20 1927 07/17/20 0118 07/18/20 0443 07/19/20 0414  WBC 3.8* 2.8* 4.4 5.8  NEUTROABS 3.4  --  3.6 4.6  HGB 11.9* 12.2 11.4* 12.3  HCT 35.3* 36.9 33.8* 36.6  MCV 87.8 90.4 87.8 89.1  PLT 131* 148* 217 286   Cardiac Enzymes: No results for input(s): CKTOTAL, CKMB, CKMBINDEX, TROPONINI in the last 168 hours. BNP: Invalid input(s): POCBNP CBG: No results for input(s): GLUCAP in the last 168 hours. D-Dimer Recent Labs    07/18/20 0443 07/19/20 0414  DDIMER 1.54* 0.98*   Hgb A1c No results for input(s): HGBA1C in the last 72 hours. Lipid Profile No results for input(s): CHOL, HDL, LDLCALC, TRIG, CHOLHDL, LDLDIRECT in the last 72 hours. Thyroid function studies No results for input(s): TSH, T4TOTAL, T3FREE, THYROIDAB in the last 72 hours.  Invalid input(s): FREET3 Anemia work up Recent Labs    07/18/20 0443 07/19/20 0414  FERRITIN 1,732* 1,678*   Urinalysis No results found for: COLORURINE, APPEARANCEUR, LABSPEC, [place], GLUCOSEU, [place], BILIRUBINUR, KETONESUR, PROTEINUR, UROBILINOGEN, NITRITE, [place]  Microbiology Recent Results (from the past 240 hour(s))  Resp Panel by RT-PCR (Flu A&B, Covid) Nasopharyngeal Swab     Status: Abnormal   Collection Time: 07/16/20  7:27 PM   Specimen: Nasopharyngeal Swab;  Nasopharyngeal(NP) swabs in vial transport medium  Result Value Ref Range Status   SARS Coronavirus 2 by RT PCR POSITIVE (A) NEGATIVE Final    Comment: RESULT CALLED TO, READ BACK BY AND VERIFIED WITH: CORALDE H. 11.19.21 @ 0216 BY MECIAL J. (NOTE) SARS-CoV-2 target nucleic acids are DETECTED.  The SARS-CoV-2 RNA is generally detectable in upper respiratory specimens during the acute phase of infection. Positive results are indicative of the presence of the identified virus, but do not rule out bacterial infection or co-infection with other pathogens not detected by the test. Clinical correlation with patient history and other diagnostic information is necessary to determine patient infection status. The expected result is Negative.  Fact Sheet for Patients: EntrepreneurPulse.com.au  Fact Sheet for Healthcare Providers: IncredibleEmployment.be  This test is not yet approved or cleared by the [place] FDA and  has been authorized for detection and/or diagnosis of SARS-CoV-2 by FDA under an Emergency Use Authorization (EUA).  This EUA will remain in effect (meaning this test  can be used) for the duration of  the COVID-19 declaration under Section 564(b)(1) of the Act, 21 U.S.C. section 360bbb-3(b)(1), unless the authorization is terminated or revoked sooner.     Influenza A by PCR NEGATIVE NEGATIVE Final   Influenza B by PCR NEGATIVE NEGATIVE Final    Comment: (NOTE) The Xpert Xpress SARS-CoV-2/FLU/RSV plus assay is intended as an aid in the diagnosis of influenza from Nasopharyngeal swab specimens and should not be used as a sole basis for treatment. Nasal washings and aspirates are unacceptable for Xpert Xpress SARS-CoV-2/FLU/RSV testing.  Fact Sheet for Patients: EntrepreneurPulse.com.au  Fact Sheet for Healthcare Providers: IncredibleEmployment.be  This test is not yet approved or cleared by  the [place] FDA and has been authorized for detection and/or diagnosis of SARS-CoV-2 by FDA under an Emergency Use Authorization (EUA). This EUA will remain in effect (meaning this test can be used) for the duration of the COVID-19 declaration under Section 564(b)(1) of the Act, 21 U.S.C. section 360bbb-3(b)(1), unless the authorization is terminated or revoked.  Performed at [facility], [place] [address], [place], [place] [address]     Time coordinating discharge: Approximately 40 minutes  Patrecia Pour, MD  [facility] 07/20/2020, 12:07 PM

## 2020-07-20 NOTE — Progress Notes (Signed)
Discharge Instructions given to patient with daughter on the phone.  Home home will be delivered to home.  Peripheral IV will be discontinued.  Patient has decreased anxiousness.  Verbalized understanding.  Will be wheel out in wheelchair and husband will transport patient home to private residence.

## 2020-07-27 ENCOUNTER — Encounter: Payer: Self-pay | Admitting: Family Medicine

## 2020-07-27 ENCOUNTER — Ambulatory Visit (INDEPENDENT_AMBULATORY_CARE_PROVIDER_SITE_OTHER): Payer: 59 | Admitting: Family Medicine

## 2020-07-27 ENCOUNTER — Other Ambulatory Visit: Payer: Self-pay

## 2020-07-27 VITALS — HR 71

## 2020-07-27 DIAGNOSIS — U071 COVID-19: Secondary | ICD-10-CM | POA: Diagnosis not present

## 2020-07-27 DIAGNOSIS — J9600 Acute respiratory failure, unspecified whether with hypoxia or hypercapnia: Secondary | ICD-10-CM | POA: Diagnosis not present

## 2020-07-27 NOTE — Patient Instructions (Addendum)
-  I want you on oxygen while walking, 2L. Your oxygen is good sitting and can go without it if watching TV.   -will see you back in 2 weeks for in office visit.   -stop aspirin on Wednesday -continue vitamin D3 2000IU/day -continue vitamin C 1000mg /day -melatonin extended release 2-6mg /nightly  Nice to see you in person! Dr. Rogers Blocker

## 2020-07-27 NOTE — Progress Notes (Signed)
Patient: Ashley [name] MRN: [id number] DOB: 30-Dec-1960 PCP: Hulan Fess, MD     Subjective:  Chief Complaint  Patient presents with  . Hospitalization Follow-up    COVID    HPI: The patient is a 59 y.o. female who presents today for hospital follow up for covid 19. She stared having symptoms of covid on 07/07/20. Seen at car outside. Her daughter and husband are also here.   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 She was tachypneicand hypoxic requiring 2L O2.SARS-CoV-2 PCR confirmed positivity, CXR demonstrated bilateral infiltrates. CRP elevated to 10.9, PCT negative. Empiric antibiotics, remdesivir, and steroids were started on admission for covid-19 pneumonia. D-dimer was elevated, CTA chest showed no PE.With treatment, hypoxia has improved, inflammatory markers have sustained downtrend, and the patient's respiratory effort has normalized. Due to PTSD related to prior ARDS admission and overall clinical stability, she is discharged 07/20/2020 with supplemental oxygen to complete a course of decadron as an outpatient.  crP 4.1--->2.3 Ferritin: 1732--->1678 cmp essentially normal  Cbc normal   Acute hypoxic resp. Failure due to covid 19 pneumonia Discharged on 1L [place] Given 5 days of remdesivir/steroids  -baricitinib was started, but stopped due to mild hypoxia  Severe sepsis -secondary to pneumonia -resolved at discharge  PTSD/anxiety bzd prn   She was seen by another doctor after hospital discharge and given pulmicort which she has been taking and ivermectin. Noticeable difference after 2 days on ivermectin. She has completed her 5 day course of this. While resting in chair she has no shortness of breath and her oxygen maintains at  94% on room air. When she gets up to walk around her oxygen does drop. She wears her oxygen while walking and at home while sleeping. She is still weak.     Review of Systems  Constitutional: Positive for fatigue. Negative for chills and fever.  Respiratory: Positive for cough. Negative for chest tightness, shortness of breath and wheezing.   Cardiovascular: Negative for chest pain, palpitations and leg swelling.  Gastrointestinal: Negative for abdominal pain, diarrhea and nausea.  Psychiatric/Behavioral: Negative for confusion and decreased concentration.    Allergies Patient has No Known Allergies.  Past Medical History Patient  has no past medical history on file.  Surgical History Patient  has no past surgical history on file.  Family History Pateint's family history is not on file.  Social History Patient  reports that she has quit smoking. She has never used smokeless tobacco. She reports previous alcohol use. She reports previous drug use.    Objective: Vitals:   07/27/20 1301  Pulse: 71  SpO2: 94%    There is no height or weight on file to calculate BMI.  Physical Exam Vitals reviewed.  Constitutional:      General: She is not in acute distress.    Appearance: Normal appearance. She is normal weight. She is not ill-appearing.  HENT:     Head: Normocephalic and atraumatic.  Cardiovascular:     Rate and Rhythm: Normal rate and regular rhythm.     Heart sounds: Normal heart sounds.  Pulmonary:     Effort: Pulmonary effort is normal. No respiratory distress.     Breath sounds: Normal breath sounds. No wheezing or rales.  Neurological:     General: No focal deficit present.  Mental Status: She is alert and oriented to person, place, and time.  Psychiatric:        Mood and Affect: Mood normal.        Behavior: Behavior normal.    Hospital records reviewed/noted.     Assessment/plan: 1. [facility] course reviewed. She is doing much better. Still  requiring oxygen while walking. I saw her at her car and walked her outside and her oxygen dropped to 87% without oxygen. Rebounded up to 93% while sitting. She couldn't bring her oxygen since not portable. im going to have her come back in 2 weeks so I can see her in office, check labs and do a proper walk test. I do want her to continue her oxygen while walking at home. Can take off while sitting.  -I think PT would do her a world of good, but she declines.  -continue vitamin C, D3 and extended release melatonin at night -stop aspirin.  -lungs sound good and can continue pulmicort, but can change to as needed as in a few days -finished ivermectin by another physician. Could tell a difference in her symptoms with this.  Can continue twice weekly x 3-4 weeks then stop. No drug interaction takes with food appropriately.   2. Acute respiratory failure due to COVID-19 [facility]) See above.   -see back in 2 weeks for walk challenge -check labs if needed.    This visit occurred during the SARS-CoV-2 public health emergency.  Safety protocols were in place, including screening questions prior to the visit, additional usage of staff PPE, and extensive cleaning of exam room while observing appropriate contact time as indicated for disinfecting solutions.     Return in about 2 weeks (around 08/10/2020) for in office visit. walk test/follow up .     Orma Flaming, MD [place]  07/27/2020

## 2020-08-13 ENCOUNTER — Other Ambulatory Visit: Payer: Self-pay

## 2020-08-13 ENCOUNTER — Ambulatory Visit (INDEPENDENT_AMBULATORY_CARE_PROVIDER_SITE_OTHER): Payer: 59 | Admitting: Family Medicine

## 2020-08-13 ENCOUNTER — Encounter: Payer: Self-pay | Admitting: Family Medicine

## 2020-08-13 VITALS — BP 96/60 | HR 64 | Temp 98.2°F | Ht 62.0 in | Wt 110.0 lb

## 2020-08-13 DIAGNOSIS — F17200 Nicotine dependence, unspecified, uncomplicated: Secondary | ICD-10-CM

## 2020-08-13 DIAGNOSIS — U071 COVID-19: Secondary | ICD-10-CM | POA: Diagnosis not present

## 2020-08-13 DIAGNOSIS — R011 Cardiac murmur, unspecified: Secondary | ICD-10-CM

## 2020-08-13 NOTE — Patient Instructions (Addendum)
-  vitamin D3: 2000IU/day only. Your D level was 71.   -vitamin C: 1000mg /day -zinc: 20mg /day  You could just stop and change to a multivitamin.   -1200mg  of calcium/day and continue the D3 at 2000IU/day (guidelines are 800IU/day)   -labs to check kidneys.   You look GREAT!!! Praise the lord!

## 2020-08-13 NOTE — Progress Notes (Signed)
Patient: Ashley [name] MRN: [id number] DOB: 27-Aug-1961 PCP: Hulan Fess, MD     Subjective:  Chief Complaint  Patient presents with  . Covid Positive    Follow up; walking challenge.   . Shortness of Breath    HPI: The patient is a 59 y.o. female who presents today to follow up post Covid for walking challenge, and possible labs. She is doing great. I have had her wearing her oxygen at night and then only during day if needed. She has not needed much at all. Was able to stand and do dishes for 40 minutes last night. Has only needed after doing a flight of stairs and working in basement. Her oxygen was 89% and quickly came up after sitting. She put on oxygen just for a little bit. She denies any shortness of breath or wheezing. Has a mild cough that is dry. She is overall doing great.   She has also stopped smoking.   Review of Systems  Constitutional: Negative for chills, fatigue and fever.  HENT: Negative for congestion, ear pain, sinus pressure, sinus pain, sore throat and trouble swallowing.   Respiratory: Negative for shortness of breath and wheezing.   Cardiovascular: Negative for chest pain, palpitations and leg swelling.  Gastrointestinal: Negative for abdominal pain, diarrhea, nausea and vomiting.    Allergies Patient has No Known Allergies.  Past Medical History Patient  has a past medical history of COVID-19.  Surgical History Patient  has no past surgical history on file.  Family History Pateint's family history is not on file.  Social History Patient  reports that she has quit smoking. She has never used smokeless tobacco. She reports previous alcohol use. She reports previous drug use.    Objective: Vitals:   08/13/20 1325  BP: 96/60  Pulse: 64  Temp: 98.2 F (36.8 C)  TempSrc: Temporal  SpO2: 96%  Weight: 110 lb (49.9 kg)  Height: 5\' 2"  (1.575 m)    Body mass index is 20.12 kg/m.  Physical Exam Vitals reviewed.  Constitutional:       Appearance: She is well-developed and normal weight.  HENT:     Head: Normocephalic and atraumatic.  Eyes:     Extraocular Movements: Extraocular movements intact.     Pupils: Pupils are equal, round, and reactive to light.  Cardiovascular:     Rate and Rhythm: Normal rate and regular rhythm.     Heart sounds: Murmur heard.    Pulmonary:     Effort: Pulmonary effort is normal.     Breath sounds: Normal breath sounds. No decreased breath sounds, wheezing, rhonchi or rales.  Abdominal:     General: Bowel sounds are normal.     Palpations: Abdomen is soft.  Musculoskeletal:     Cervical back: Normal range of motion and neck supple.  Skin:    General: Skin is warm.     Capillary Refill: Capillary refill takes less than 2 seconds.  Neurological:     General: No focal deficit present.     Mental Status: She is alert and oriented to person, place, and time.  Psychiatric:        Mood and Affect: Mood normal.        Behavior: Behavior normal.    1 minute walk test: oxygen stayed 95-96% on room air 3 minute walk test: 91% and jumped up to 96% with stopping.     Assessment/plan: 1. COVID-19 Recovered and doing great. I want them to stop oxygen. Can continue to  check pulse ox If feels winded or short of breath, but discussed I still want her to slowly work on building up her walking. She was walking a few miles/day. Will check renal function today. Would change over to a multivitamin and discussed appropriate calcium/vitamin D for her age for bone protection.  - COMPLETE METABOLIC PANEL WITH GFR; Future  2. Cardiac murmur Quiet, benign sounding systolic murmur. She tells me she has had for 15 years. Asymptomatic. Continue to monitor.   This visit occurred during the SARS-CoV-2 public health emergency.  Safety protocols were in place, including screening questions prior to the visit, additional usage of staff PPE, and extensive cleaning of exam room while observing appropriate contact  time as indicated for disinfecting solutions.    Return if symptoms worsen or fail to improve.    Orma Flaming, MD [place]   08/13/2020

## 2020-08-14 LAB — COMPLETE METABOLIC PANEL WITH GFR
AG Ratio: 1.6 (calc) (ref 1.0–2.5)
ALT: 11 U/L (ref 6–29)
AST: 17 U/L (ref 10–35)
Albumin: 3.7 g/dL (ref 3.6–5.1)
Alkaline phosphatase (APISO): 62 U/L (ref 37–153)
BUN: 7 mg/dL (ref 7–25)
CO2: 29 mmol/L (ref 20–32)
Calcium: 9.1 mg/dL (ref 8.6–10.4)
Chloride: 103 mmol/L (ref 98–110)
Creat: 0.66 mg/dL (ref 0.50–0.99)
GFR, Est African American: 111 mL/min/{1.73_m2} (ref >=60)
GFR, Est Non African American: 96 mL/min/{1.73_m2} (ref >=60)
Globulin: 2.3 g/dL (calc) (ref 1.9–3.7)
Glucose, Bld: 77 mg/dL (ref 65–99)
Potassium: 4.2 mmol/L (ref 3.5–5.3)
Sodium: 140 mmol/L (ref 135–146)
Total Bilirubin: 0.5 mg/dL (ref 0.2–1.2)
Total Protein: 6 g/dL — ABNORMAL LOW (ref 6.1–8.1)

## 2020-08-19 ENCOUNTER — Telehealth: Payer: Self-pay

## 2020-08-19 NOTE — Telephone Encounter (Signed)
Please let her know I sent her letter via mychart and we have faxed letter over to [facility] as requested.  Dr. Rogers Blocker

## 2020-08-19 NOTE — Telephone Encounter (Signed)
Patient called in stating she was seen by [facility] after she got out of the hospital and was put on oxygen. Zilpha states she no longer needs it but [place] is requiring that she get a letter. Fax number is [phone]

## 2020-08-24 NOTE — Telephone Encounter (Signed)
I attempted to give pt message below, but was unable to lvm. Mailbox was full.

## 2020-08-27 ENCOUNTER — Telehealth: Payer: Self-pay

## 2020-08-27 NOTE — Telephone Encounter (Signed)
Pt says that she was D/C from hospital on 11/22. Pt needs another letter from Dr Artis Flock stating that she no longer needs oxygen, so that her oxygen tank can be picked up.  [facility] Fax# [phone] or back up fax # [phone].

## 2020-08-27 NOTE — Telephone Encounter (Signed)
Resent fax to both numbers.  Orland Mustard, MD [facility]

## 2020-11-09 ENCOUNTER — Other Ambulatory Visit: Payer: 59

## 2020-11-09 ENCOUNTER — Encounter: Payer: Self-pay | Admitting: Family Medicine

## 2020-11-09 ENCOUNTER — Other Ambulatory Visit: Payer: Self-pay

## 2020-11-09 ENCOUNTER — Ambulatory Visit (INDEPENDENT_AMBULATORY_CARE_PROVIDER_SITE_OTHER): Payer: 59 | Admitting: Family Medicine

## 2020-11-09 VITALS — BP 99/57 | HR 57 | Temp 98.3°F | Ht 62.0 in | Wt 112.8 lb

## 2020-11-09 DIAGNOSIS — L659 Nonscarring hair loss, unspecified: Secondary | ICD-10-CM | POA: Diagnosis not present

## 2020-11-09 DIAGNOSIS — F411 Generalized anxiety disorder: Secondary | ICD-10-CM | POA: Diagnosis not present

## 2020-11-09 DIAGNOSIS — Z1159 Encounter for screening for other viral diseases: Secondary | ICD-10-CM | POA: Diagnosis not present

## 2020-11-09 MED ORDER — TRAZODONE HCL 100 MG PO TABS: 100.0000 mg | ORAL_TABLET | Freq: Every evening | ORAL | 3 refills | Status: DC | PRN

## 2020-11-09 NOTE — Progress Notes (Signed)
Patient: Ashley [name] MRN: [id number] DOB: September 10, 1960 PCP: Orma Flaming, MD     Subjective:  Chief Complaint  Patient presents with  . Anxiety  . transfer of care    HPI: The patient is a 60 y.o. female who presents today for transfer of care and Anxiety. Her doctor just retired. 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 She had no anxiety prior to this and struggles since then with flash backs to the hospital from that time. She takes the xanax to cope with this. Anxiety is worse at night as she really thinks about things. Her previous pcp has tried 2 daily anxiety medications that she couldn't tolerate and had adverse reactions to. She is tearful today at times.   Hair loss She has been losing handfulls of hair since covid. Very worried she has a vitamin deficiency. No other hair loss except head. Has slowed down.   NO family hx of colon or breast cancer.   Review of Systems  Constitutional: Negative for chills, fatigue and fever.  HENT: Negative for dental problem, ear pain, hearing loss and trouble swallowing.   Eyes: Negative for visual disturbance.  Respiratory: Negative for cough, chest tightness and shortness of breath.   Cardiovascular: Negative for chest pain, palpitations and leg swelling.  Gastrointestinal: Negative for abdominal pain, blood in stool, diarrhea and nausea.  Endocrine: Negative for cold intolerance, polydipsia, polyphagia and polyuria.  Genitourinary: Negative for dysuria and hematuria.  Musculoskeletal: Negative for arthralgias.  Skin: Negative for rash.  Neurological: Negative for dizziness and headaches.  Psychiatric/Behavioral: Negative for dysphoric mood and sleep disturbance. The patient is nervous/anxious.     Allergies Patient has No Known Allergies.  Past Medical History Patient  has a past medical history of COVID-19.  Surgical History Patient  has no past surgical history  on file.  Family History Pateint's family history is not on file.  Social History Patient  reports that she has quit smoking. She has never used smokeless tobacco. She reports previous alcohol use. She reports previous drug use.    Objective: Vitals:   11/09/20 1139  BP: (!) 99/57  Pulse: (!) 57  Temp: 98.3 F (36.8 C)  TempSrc: Temporal  SpO2: 98%  Weight: 112 lb 12.8 oz (51.2 kg)  Height: 5\' 2"  (1.575 m)    Body mass index is 20.63 kg/m.  Physical Exam Vitals reviewed.  Constitutional:      Appearance: Normal appearance. She is well-developed and normal weight.  HENT:     Head: Normocephalic and atraumatic.     Right Ear: External ear normal.     Left Ear: External ear normal.  Eyes:     Conjunctiva/sclera: Conjunctivae normal.     Pupils: Pupils are equal, round, and reactive to light.  Neck:     Thyroid: No thyromegaly.  Cardiovascular:     Rate and Rhythm: Normal rate and regular rhythm.     Heart sounds: Murmur heard.    Pulmonary:     Effort: Pulmonary effort is normal.     Breath sounds: Normal breath sounds.  Abdominal:     General: Abdomen is flat. Bowel sounds are normal. There is no distension.     Palpations: Abdomen is soft.     Tenderness: There is no abdominal tenderness.  Musculoskeletal:     Cervical back: Normal range of motion and neck supple.  Lymphadenopathy:     Cervical:  No cervical adenopathy.  Skin:    General: Skin is warm and dry.     Capillary Refill: Capillary refill takes less than 2 seconds.     Findings: No rash.  Neurological:     General: No focal deficit present.     Mental Status: She is alert and oriented to person, place, and time.     Cranial Nerves: No cranial nerve deficit.     Coordination: Coordination normal.     Deep Tendon Reflexes: Reflexes normal.  Psychiatric:        Mood and Affect: Mood normal.        Behavior: Behavior normal.     Comments: Tearful at times.         GAD 7 : Generalized Anxiety  Score 11/09/2020  Nervous, Anxious, on Edge 3  Control/stop worrying 2  Worry too much - different things 3  Trouble relaxing 2  Restless 2  Easily annoyed or irritable 0  Afraid - awful might happen 2  Total GAD 7 Score 14  Anxiety Difficulty Somewhat difficult     Assessment/plan: 1. GAD (generalized anxiety disorder) GAD7 score is elevated, moderate anxiety. Discussed starting a daily medication, but will get records before I do this to see what he tried   2. Hair loss Discussed very common with covid. 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 Will check labs thought to rule out other causes.  - CBC with Differential/Platelet - Comprehensive metabolic panel - TSH - VITAMIN D 25 Hydroxy (Vit-D Deficiency, Fractures) - Vitamin B12  3. Encounter for hepatitis C screening test for low risk patient  - Hepatitis C antibody   This visit occurred during the SARS-CoV-2 public health emergency.  Safety protocols were in place, including screening questions prior to the visit, additional usage of staff PPE, and extensive cleaning of exam room while observing appropriate contact time as indicated for disinfecting solutions.      Return in about 6 months (around 05/12/2021) for anxiety .   Orma Flaming, MD [place]   11/09/2020

## 2020-11-09 NOTE — Patient Instructions (Addendum)
Hair loss related to covid infection very, very common. Will check labs to rule out other things. It's called telogen effluvium  Refilled medication for you and will wait for records. I do think you need a daily drug like prozac so let's get records before I add anything on.   See you in 6 months! Aw

## 2020-11-10 LAB — VITAMIN D 25 HYDROXY (VIT D DEFICIENCY, FRACTURES): VITD: 49.07 ng/mL (ref 30.00–100.00)

## 2020-11-10 LAB — COMPREHENSIVE METABOLIC PANEL
ALT: 11 U/L (ref 0–35)
AST: 17 U/L (ref 0–37)
Albumin: 3.9 g/dL (ref 3.5–5.2)
Alkaline Phosphatase: 56 U/L (ref 39–117)
BUN: 12 mg/dL (ref 6–23)
CO2: 29 mEq/L (ref 19–32)
Calcium: 8.8 mg/dL (ref 8.4–10.5)
Chloride: 99 mEq/L (ref 96–112)
Creatinine, Ser: 0.7 mg/dL (ref 0.40–1.20)
GFR: 93.9 mL/min (ref >60.00)
Glucose, Bld: 123 mg/dL — ABNORMAL HIGH (ref 70–99)
Potassium: 3.6 mEq/L (ref 3.5–5.1)
Sodium: 134 mEq/L — ABNORMAL LOW (ref 135–145)
Total Bilirubin: 0.5 mg/dL (ref 0.2–1.2)
Total Protein: 5.8 g/dL — ABNORMAL LOW (ref 6.0–8.3)

## 2020-11-10 LAB — CBC WITH DIFFERENTIAL/PLATELET
Basophils Absolute: 0.1 10*3/uL (ref 0.0–0.1)
Basophils Relative: 1.4 % (ref 0.0–3.0)
Eosinophils Absolute: 0.1 10*3/uL (ref 0.0–0.7)
Eosinophils Relative: 1.6 % (ref 0.0–5.0)
HCT: 38 % (ref 36.0–46.0)
Hemoglobin: 13 g/dL (ref 12.0–15.0)
Lymphocytes Relative: 26.6 % (ref 12.0–46.0)
Lymphs Abs: 1.8 10*3/uL (ref 0.7–4.0)
MCHC: 34.2 g/dL (ref 30.0–36.0)
MCV: 88.7 fl (ref 78.0–100.0)
Monocytes Absolute: 0.4 10*3/uL (ref 0.1–1.0)
Monocytes Relative: 6.2 % (ref 3.0–12.0)
Neutro Abs: 4.3 10*3/uL (ref 1.4–7.7)
Neutrophils Relative %: 64.2 % (ref 43.0–77.0)
Platelets: 185 10*3/uL (ref 150.0–400.0)
RBC: 4.28 Mil/uL (ref 3.87–5.11)
RDW: 12.9 % (ref 11.5–15.5)
WBC: 6.6 10*3/uL (ref 4.0–10.5)

## 2020-11-10 LAB — HEPATITIS C ANTIBODY
Hepatitis C Ab: NONREACTIVE
SIGNAL TO CUT-OFF: 0 (ref <1.00)

## 2020-11-10 LAB — TSH: TSH: 0.71 u[IU]/mL (ref 0.35–4.50)

## 2020-11-10 LAB — VITAMIN B12: Vitamin B-12: 232 pg/mL (ref 211–911)

## 2020-11-11 ENCOUNTER — Other Ambulatory Visit (INDEPENDENT_AMBULATORY_CARE_PROVIDER_SITE_OTHER): Payer: 59

## 2020-11-11 ENCOUNTER — Other Ambulatory Visit: Payer: Self-pay | Admitting: Family Medicine

## 2020-11-11 DIAGNOSIS — R7309 Other abnormal glucose: Secondary | ICD-10-CM | POA: Diagnosis not present

## 2020-11-11 LAB — HEMOGLOBIN A1C: Hgb A1c MFr Bld: 4.8 % (ref 4.6–6.5)

## 2020-12-01 ENCOUNTER — Encounter: Payer: Self-pay | Admitting: Family Medicine

## 2021-03-30 ENCOUNTER — Other Ambulatory Visit: Payer: Self-pay

## 2021-03-30 NOTE — Telephone Encounter (Signed)
MEDICATION: ALPRAZolam (XANAX) 0.5 MG tablet  PHARMACY:  [id number] [facility] [address] Phone:  [phone] Fax:  [phone]     Comments: Patient is scheduled for appt with alyssa on 05/14/21  **Let patient know to contact pharmacy at the end of the day to make sure medication is ready. **  ** Please notify patient to allow 48-72 hours to process**  **Encourage patient to contact the pharmacy for refills or they can [facility]

## 2021-03-31 MED ORDER — ALPRAZOLAM 0.5 MG PO TABS
0.5000 mg | ORAL_TABLET | Freq: Two times a day (BID) | ORAL | 1 refills | Status: DC | PRN
Start: 2021-03-31 — End: 2021-05-14

## 2021-03-31 NOTE — Telephone Encounter (Signed)
Pt notified Rx for Xanax was filled and sent to pharmacy. Pt verbalized understanding.

## 2021-03-31 NOTE — Telephone Encounter (Signed)
Pt requesting refill for Alprazolam 0.5 mg BID PRN. Pt is scheduled to see Alyssa on 9/16. Pt is out of medication. Will you please fill since Alyssa is out?

## 2021-03-31 NOTE — Telephone Encounter (Signed)
Pt called about her medication. She said that she really needs her medication refilled

## 2021-05-14 ENCOUNTER — Ambulatory Visit: Payer: 59 | Admitting: Family Medicine

## 2021-05-14 ENCOUNTER — Other Ambulatory Visit: Payer: Self-pay

## 2021-05-14 ENCOUNTER — Ambulatory Visit (INDEPENDENT_AMBULATORY_CARE_PROVIDER_SITE_OTHER): Payer: 59 | Admitting: Physician Assistant

## 2021-05-14 ENCOUNTER — Encounter: Payer: Self-pay | Admitting: Physician Assistant

## 2021-05-14 VITALS — BP 107/62 | HR 66 | Temp 98.0°F | Ht 62.0 in | Wt 114.2 lb

## 2021-05-14 DIAGNOSIS — F411 Generalized anxiety disorder: Secondary | ICD-10-CM | POA: Diagnosis not present

## 2021-05-14 DIAGNOSIS — G4700 Insomnia, unspecified: Secondary | ICD-10-CM

## 2021-05-14 MED ORDER — ALPRAZOLAM 0.5 MG PO TABS
0.5000 mg | ORAL_TABLET | Freq: Two times a day (BID) | ORAL | 2 refills | Status: DC | PRN
Start: 2021-05-14 — End: 2021-10-29

## 2021-05-14 NOTE — Progress Notes (Signed)
Established Patient Office Visit  Subjective:  Patient ID: Ashley [name], female    DOB: January 31, 1961  Age: 60 y.o. MRN: [id number]  CC:  Chief Complaint  Patient presents with   Anxiety    HPI Ashley [name] presents for [facility] visit from Dr. Rogers Blocker. Her former physician from [place] retired.  Patient states that she has been suffering from terrible anxiety.  She tries to manage with prayer and meditation.  She is currently taking Xanax 0.5 mg twice daily.  She has previously failed Prozac and Lexapro from her [place], Dr. Rex Kras. She lost her mother in February 2022 from [place] and dementia. Patient was previously in a coma for one month due to ARDS when she was in her 4s and feels like her anxiety symptoms were worsened since then. 41 her 3 yo granddaughter daily and her 24 mo old granddaughter one time per week, which helps to some degree as well.  Denies any suicidal ideation.  She does not have a Social worker.  She has not seen psychiatry.  She does take trazodone 100 mg at bedtime to help with sleep.   Past Medical History:  Diagnosis Date   COVID-19     No past surgical history on file.  No family history on file.  Social History   Socioeconomic History   Marital status: Married    Spouse name: Not on file   Number of children: Not on file   Years of education: Not on file   Highest education level: Not on file  Occupational History   Not on file  Tobacco Use   Smoking status: Former   Smokeless tobacco: Never  Substance and Sexual Activity   Alcohol use: Not Currently   Drug use: Not Currently   Sexual activity: Not on file  Other Topics Concern   Not on file  Social History Narrative   Not on file   Social Determinants of Health   Financial Resource Strain: Not on file  Food Insecurity: Not on file  Transportation Needs: Not on file  Physical Activity: Not on file  Stress: Not on file  Social Connections: Not on file  Intimate Partner Violence: Not  on file    Outpatient Medications Prior to Visit  Medication Sig Dispense Refill   ALPRAZolam (XANAX) 0.5 MG tablet Take 1 tablet (0.5 mg total) by mouth 2 (two) times daily as needed for anxiety. 60 tablet 1   Ascorbic Acid (VITAMIN C) 1000 MG tablet Take 1,000 mg by mouth daily.     Multiple Vitamins-Minerals (ZINC PO) Take by mouth.     traZODone (DESYREL) 100 MG tablet Take 1 tablet (100 mg total) by mouth at bedtime as needed for sleep. 30 tablet 3   No facility-administered medications prior to visit.    No Known Allergies  ROS Review of Systems REFER TO HPI FOR PERTINENT POSITIVES AND NEGATIVES    Objective:    Physical Exam Vitals reviewed.  Constitutional:      Appearance: Normal appearance. She is well-developed and normal weight.  HENT:     Head: Normocephalic and atraumatic.     Right Ear: External ear normal.     Left Ear: External ear normal.  Eyes:     Conjunctiva/sclera: Conjunctivae normal.     Pupils: Pupils are equal, round, and reactive to light.  Neck:     Thyroid: No thyromegaly.  Cardiovascular:     Rate and Rhythm: Normal rate and regular rhythm.     Heart  sounds: Murmur heard.  Pulmonary:     Effort: Pulmonary effort is normal.     Breath sounds: Normal breath sounds.  Abdominal:     General: Abdomen is flat. Bowel sounds are normal. There is no distension.     Palpations: Abdomen is soft.     Tenderness: There is no abdominal tenderness.  Musculoskeletal:     Cervical back: Normal range of motion and neck supple.  Lymphadenopathy:     Cervical: No cervical adenopathy.  Skin:    General: Skin is warm and dry.     Capillary Refill: Capillary refill takes less than 2 seconds.     Findings: No rash.  Neurological:     General: No focal deficit present.     Mental Status: She is alert and oriented to person, place, and time.     Cranial Nerves: No cranial nerve deficit.     Coordination: Coordination normal.     Deep Tendon Reflexes:  Reflexes normal.  Psychiatric:        Mood and Affect: Mood normal.        Behavior: Behavior normal.     Comments: Tearful for most of the appointment.    BP 107/62   Pulse 66   Temp 98 F (36.7 C)   Ht '5\' 2"'$  (1.575 m)   Wt 114 lb 3.2 oz (51.8 kg)   SpO2 97%   BMI 20.89 kg/m  Wt Readings from Last 3 Encounters:  05/14/21 114 lb 3.2 oz (51.8 kg)  11/09/20 112 lb 12.8 oz (51.2 kg)  08/13/20 110 lb (49.9 kg)     Health Maintenance Due  Topic Date Due   COVID-19 Vaccine (1) Never done   Pneumococcal Vaccine 15-87 Years old (1 - PCV) Never done   TETANUS/TDAP  Never done   PAP SMEAR-Modifier  Never done   COLONOSCOPY (Pts 45-32yr Insurance coverage will need to be confirmed)  Never done   Zoster Vaccines- Shingrix (1 of 2) Never done   MAMMOGRAM  02/10/2020   INFLUENZA VACCINE  Never done    There are no preventive care reminders to display for this patient.  Lab Results  Component Value Date   TSH 0.71 11/09/2020   Lab Results  Component Value Date   WBC 6.6 11/09/2020   HGB 13.0 11/09/2020   HCT 38.0 11/09/2020   MCV 88.7 11/09/2020   PLT 185.0 11/09/2020   Lab Results  Component Value Date   NA 134 (L) 11/09/2020   K 3.6 11/09/2020   CO2 29 11/09/2020   GLUCOSE 123 (H) 11/09/2020   BUN 12 11/09/2020   CREATININE 0.70 11/09/2020   BILITOT 0.5 11/09/2020   ALKPHOS 56 11/09/2020   AST 17 11/09/2020   ALT 11 11/09/2020   PROT 5.8 (L) 11/09/2020   ALBUMIN 3.9 11/09/2020   CALCIUM 8.8 11/09/2020   ANIONGAP 9 07/19/2020   GFR 93.90 11/09/2020   Lab Results  Component Value Date   CHOL 219 (A) 07/31/2019   No results found for: HDL No results found for: LDLCALC No results found for: TRIG No results found for: CHOLHDL Lab Results  Component Value Date   HGBA1C 4.8 11/11/2020      Assessment & Plan:   Problem List Items Addressed This Visit   None  1. GAD (generalized anxiety disorder) 2. Insomnia, unspecified type  GAD 7 : Generalized  Anxiety Score 05/14/2021 11/09/2020  Nervous, Anxious, on Edge 2 3  Control/stop worrying 3 2  Worry too  much - different things 3 3  Trouble relaxing 3 2  Restless 3 2  Easily annoyed or irritable 1 0  Afraid - awful might happen 2 2  Total GAD 7 Score 17 14  Anxiety Difficulty Somewhat difficult Somewhat difficult   PHQ9 SCORE ONLY 05/14/2021 11/09/2020  PHQ-9 Total Score 10 0    Severe anxiety symptoms and moderate depression symptoms.  She needs a daily baseline medication.  She says that she did not do well with Prozac or Lexapro and she is nervous to try anything else.  She is currently taking Xanax 0.5 mg twice daily, which honestly I do not think is helping that much for her.  PDMP reviewed and this medication is refilled for her though.  I strongly encourage a psychology and psychiatry referral for additional help navigating through this with her.  Thankfully no SI.  She knows to call [phone] or go to the hospital should this occur.   Follow-up: No follow-ups on file.    Shimshon Narula M Shylo Dillenbeck, PA-C

## 2021-07-07 ENCOUNTER — Ambulatory Visit: Payer: 59 | Admitting: Family Medicine

## 2021-10-28 ENCOUNTER — Other Ambulatory Visit: Payer: Self-pay | Admitting: Physician Assistant

## 2021-10-29 ENCOUNTER — Other Ambulatory Visit: Payer: Self-pay | Admitting: Physician Assistant

## 2021-10-29 ENCOUNTER — Telehealth: Payer: Self-pay | Admitting: Physician Assistant

## 2021-10-29 MED ORDER — ALPRAZOLAM 0.5 MG PO TABS: 0.5000 mg | ORAL_TABLET | Freq: Two times a day (BID) | ORAL | 0 refills | Status: DC | PRN

## 2021-10-29 NOTE — Telephone Encounter (Signed)
.. ?  Encourage patient to contact the pharmacy for refills or they can request refills through [facility] ? ?LAST APPOINTMENT DATE:  05/14/21 ? ?NEXT APPOINTMENT DATE: 11/12/21 ? ?MEDICATION:ALPRAZolam (XANAX) 0.5 MG tablet ? ?Is the patient out of medication? No ? ?[id number] [place] Phone:  [phone]  ?Fax:  [phone]  ?  ? ? ?Let patient know to contact pharmacy at the end of the day to make sure medication is ready. ? ?Please notify patient to allow 48-72 hours to process  ?

## 2021-11-12 ENCOUNTER — Ambulatory Visit (INDEPENDENT_AMBULATORY_CARE_PROVIDER_SITE_OTHER): Payer: Managed Care, Other (non HMO) | Admitting: Physician Assistant

## 2021-11-12 VITALS — BP 95/61 | HR 66 | Temp 97.2°F | Ht 62.0 in | Wt 101.2 lb

## 2021-11-12 DIAGNOSIS — G4700 Insomnia, unspecified: Secondary | ICD-10-CM | POA: Diagnosis not present

## 2021-11-12 DIAGNOSIS — R634 Abnormal weight loss: Secondary | ICD-10-CM

## 2021-11-12 DIAGNOSIS — F411 Generalized anxiety disorder: Secondary | ICD-10-CM | POA: Diagnosis not present

## 2021-11-12 MED ORDER — ALPRAZOLAM 0.5 MG PO TABS: 0.5000 mg | ORAL_TABLET | Freq: Two times a day (BID) | ORAL | 1 refills | Status: AC | PRN

## 2021-11-12 NOTE — Progress Notes (Signed)
? ?Subjective:  ? ? Patient ID: Ashley [name], female    DOB: 11-Jan-1961, 61 y.o.   MRN: [id number] ? ?Chief Complaint  ?Patient presents with  ? Follow-up  ? ? ?HPI ?Patient is in today for f/up from 05/14/21: ? ?"Ashley [name] presents for [facility] visit from Dr. Rogers Blocker. Her former physician from [place] retired.  Patient states that she has been suffering from terrible anxiety.  She tries to manage with prayer and meditation.  She is currently taking Xanax 0.5 mg twice daily.  She has previously failed Prozac and Lexapro from her [place], Dr. Rex Kras. She lost her mother in February 2022 from [place] and dementia. Patient was previously in a coma for one month due to ARDS when she was in her 16s and feels like her anxiety symptoms were worsened since then. ?Babysits her 79 yo granddaughter daily and her 74 mo old granddaughter one time per week, which helps to some degree as well.  Denies any suicidal ideation.  She does not have a Social worker.  She has not seen psychiatry.  She does take trazodone 100 mg at bedtime to help with sleep." ? ?Today: ?Goes to bed around 9 pm, sometimes waking up 4:30, 5, or 5:30, starts to feel tired around 2 pm. Takes Trazodone 100 mg every evening.  ?Naps in the afternoon with grandbaby occasionally.  ? ?Alprazolam one tab every day, then occ 1/2 or 1/4 tab in the evenings for sleep.  ? ?Abnormal weight loss, 13 lbs in the last 6 months. Says she's been going up and down stairs for the puppy. Chases grandkids around. Doesn't eat breakfast. She has been missing lunches most days because of business as well. Eats dinner daily. Colonscopy UTD as of last year. Not UTD on mammograms.  ? ?Past Medical History:  ?Diagnosis Date  ? COVID-19   ? ? ?No past surgical history on file. ? ?No family history on file. ? ?Social History  ? ?Tobacco Use  ? Smoking status: Former  ? Smokeless tobacco: Never  ?Substance Use Topics  ? Alcohol use: Not Currently  ? Drug use: Not Currently  ?  ? ?No Known  Allergies ? ?Review of Systems ?NEGATIVE UNLESS OTHERWISE INDICATED IN HPI ? ? ?   ?Objective:  ?  ? ?BP 95/61   Pulse 66   Temp (!) 97.2 ?F (36.2 ?C)   Ht '5\' 2"'$  (1.575 m)   Wt 101 lb 3.2 oz (45.9 kg)   SpO2 98%   BMI 18.51 kg/m?  ? ?Wt Readings from Last 3 Encounters:  ?11/12/21 101 lb 3.2 oz (45.9 kg)  ?05/14/21 114 lb 3.2 oz (51.8 kg)  ?11/09/20 112 lb 12.8 oz (51.2 kg)  ? ? ?BP Readings from Last 3 Encounters:  ?11/12/21 95/61  ?05/14/21 107/62  ?11/09/20 (!) 99/57  ?  ? ?Physical Exam ?Constitutional:   ?   General: She is not in acute distress. ?   Appearance: Normal appearance. She is not ill-appearing.  ?   Comments: thin  ?Cardiovascular:  ?   Pulses: Normal pulses.  ?Pulmonary:  ?   Effort: Pulmonary effort is normal.  ?Neurological:  ?   Mental Status: She is alert.  ?Psychiatric:     ?   Mood and Affect: Mood normal.     ?   Behavior: Behavior normal.  ? ? ?   ?Assessment & Plan:  ? ?Problem List Items Addressed This Visit   ?None ?Visit Diagnoses   ? ? GAD (generalized  anxiety disorder)    -  Primary  ? Relevant Medications  ? ALPRAZolam (XANAX) 0.5 MG tablet  ? Insomnia, unspecified type      ? Abnormal weight loss      ? ?  ? ? ? ?Meds ordered this encounter  ?Medications  ? ALPRAZolam (XANAX) 0.5 MG tablet  ?  Sig: Take 1 tablet (0.5 mg total) by mouth 2 (two) times daily as needed for anxiety.  ?  Dispense:  60 tablet  ?  Refill:  1  ? ? ?1. GAD (generalized anxiety disorder) ?PDMP reviewed today, no red flags, filling appropriately.  ?Refilled Xanax 0.5 mg. ?Stable, no other concerns. Still working through grief of loss of mom last year.  ? ?2. Insomnia, unspecified type ?Trazodone 100 mg qhs, may increase to 150 mg if needed ?Sleep hygiene discussed - advised to try to avoid naps in the afternoon ? ?3. Abnormal weight loss ?-13 lb in 6 months ?Likely stress related / calorie deficit ?Offered labs / update mammogram - pt declined at this time ?Cont to monitor closely, advised more protein in  diet ? ?Recheck in 3 months or prn  ? ? ? ?This note was prepared with assistance of Systems analyst. Occasional wrong-word or sound-a-like substitutions may have occurred due to the inherent limitations of voice recognition software. ? ? ?Jayren Cease M Adyn Hoes, PA-C ?

## 2022-02-11 ENCOUNTER — Encounter: Payer: Self-pay | Admitting: Physician Assistant

## 2022-02-11 ENCOUNTER — Ambulatory Visit (INDEPENDENT_AMBULATORY_CARE_PROVIDER_SITE_OTHER)
Admission: RE | Admit: 2022-02-11 | Discharge: 2022-02-11 | Disposition: A | Discharge status: Home / Self Care | Payer: Commercial Managed Care - HMO | Source: Ambulatory Visit | Attending: Physician Assistant | Admitting: Physician Assistant

## 2022-02-11 ENCOUNTER — Ambulatory Visit (INDEPENDENT_AMBULATORY_CARE_PROVIDER_SITE_OTHER): Payer: Commercial Managed Care - HMO | Admitting: Physician Assistant

## 2022-02-11 ENCOUNTER — Other Ambulatory Visit: Payer: Self-pay

## 2022-02-11 VITALS — BP 97/62 | HR 65 | Temp 98.7°F | Ht 62.0 in | Wt 100.6 lb

## 2022-02-11 DIAGNOSIS — Z87891 Personal history of nicotine dependence: Secondary | ICD-10-CM

## 2022-02-11 DIAGNOSIS — Z872 Personal history of diseases of the skin and subcutaneous tissue: Secondary | ICD-10-CM

## 2022-02-11 DIAGNOSIS — R634 Abnormal weight loss: Secondary | ICD-10-CM

## 2022-02-11 LAB — CBC WITH DIFFERENTIAL/PLATELET
Basophils Absolute: 0.1 10*3/uL (ref 0.0–0.1)
Basophils Relative: 1 % (ref 0.0–3.0)
Eosinophils Absolute: 0.1 10*3/uL (ref 0.0–0.7)
Eosinophils Relative: 1.1 % (ref 0.0–5.0)
HCT: 42.6 % (ref 36.0–46.0)
Hemoglobin: 14.1 g/dL (ref 12.0–15.0)
Lymphocytes Relative: 24.2 % (ref 12.0–46.0)
Lymphs Abs: 2.2 10*3/uL (ref 0.7–4.0)
MCHC: 33.2 g/dL (ref 30.0–36.0)
MCV: 94.2 fl (ref 78.0–100.0)
Monocytes Absolute: 0.5 10*3/uL (ref 0.1–1.0)
Monocytes Relative: 5.5 % (ref 3.0–12.0)
Neutro Abs: 6.2 10*3/uL (ref 1.4–7.7)
Neutrophils Relative %: 68.2 % (ref 43.0–77.0)
Platelets: 200 10*3/uL (ref 150.0–400.0)
RBC: 4.52 Mil/uL (ref 3.87–5.11)
RDW: 14 % (ref 11.5–15.5)
WBC: 9.1 10*3/uL (ref 4.0–10.5)

## 2022-02-11 LAB — POC URINALSYSI DIPSTICK (AUTOMATED)
Bilirubin, UA: NEGATIVE
Blood, UA: NEGATIVE
Glucose, UA: NEGATIVE
Ketones, UA: NEGATIVE
Leukocytes, UA: NEGATIVE
Nitrite, UA: NEGATIVE
Protein, UA: NEGATIVE
Spec Grav, UA: 1.01 (ref 1.010–1.025)
Urobilinogen, UA: 0.2 E.U./dL
pH, UA: 6 (ref 5.0–8.0)

## 2022-02-11 LAB — COMPREHENSIVE METABOLIC PANEL
ALT: 32 U/L (ref 0–35)
AST: 32 U/L (ref 0–37)
Albumin: 4.1 g/dL (ref 3.5–5.2)
Alkaline Phosphatase: 85 U/L (ref 39–117)
BUN: 15 mg/dL (ref 6–23)
CO2: 29 mEq/L (ref 19–32)
Calcium: 9.2 mg/dL (ref 8.4–10.5)
Chloride: 102 mEq/L (ref 96–112)
Creatinine, Ser: 0.77 mg/dL (ref 0.40–1.20)
GFR: 83.02 mL/min (ref >60.00)
Glucose, Bld: 90 mg/dL (ref 70–99)
Potassium: 5.5 mEq/L — ABNORMAL HIGH (ref 3.5–5.1)
Sodium: 140 mEq/L (ref 135–145)
Total Bilirubin: 0.5 mg/dL (ref 0.2–1.2)
Total Protein: 6.4 g/dL (ref 6.0–8.3)

## 2022-02-11 LAB — C-REACTIVE PROTEIN: CRP: <1 mg/dL (ref 0.5–20.0)

## 2022-02-11 LAB — TSH: TSH: 1.1 u[IU]/mL (ref 0.35–5.50)

## 2022-02-11 LAB — HEMOGLOBIN A1C: Hgb A1c MFr Bld: 5 % (ref 4.6–6.5)

## 2022-02-11 LAB — SEDIMENTATION RATE: Sed Rate: 6 mm/hr (ref 0–30)

## 2022-02-11 MED ORDER — ALPRAZOLAM 0.5 MG PO TABS: 0.5000 mg | ORAL_TABLET | Freq: Two times a day (BID) | ORAL | 0 refills | Status: AC | PRN

## 2022-02-11 MED ORDER — TRAZODONE HCL 100 MG PO TABS: 100.0000 mg | ORAL_TABLET | Freq: Every evening | ORAL | 3 refills | Status: DC | PRN

## 2022-02-11 NOTE — Progress Notes (Signed)
Subjective:    Patient ID: Ashley [name], female    DOB: [birth year], 61 y.o.   MRN: [id number]  Chief Complaint  Patient presents with   Follow-up    Pt being seen for 3 mon f/u for medication and weight check; pt states not trying to lose weight but lost since last visit; pt wants to discuss why she keeps getting pink eye frequently and has changed out pillow etc, but keeping grandchildren as well. Pt needing Pap Smear and will discuss today    HPI Patient is in today for f/up on abnormal weight loss. Getting about 98-100 lbs on her scale at home.  Eats small, frequent meals throughout the day. Takes B12 daily. Feels like her energy is good and she's keeping up with grandkids. Walks 1-2 miles most days.  No bowel changes. No blood in stool. Stays regular. No urinary concerns. No dizziness. No abd pain / n/v/d. No night sweats, gets up to urinate once per night. No weakness, good strength. Sleep is better - Trazodone most nights.   Says she is getting quite a few headaches - prone to migraines - sunshine does seem to trigger. Nothing very concerning to her.   Stress is about the same. Takes Xanax 0.5 mg twice daily and this helps.   Former smoker - 1 to 1.5 ppd from age 26 to 57. Vaped for some time after that. Quit after COVID infection about 2-3 years ago.   Still needs to update mammogram. Has not noted any changes. Colonoscopy UTD 2022.   Past Medical History:  Diagnosis Date   Coma ([place])    medically induced   COVID-19     History reviewed. No pertinent surgical history.  Family History  Problem Relation Age of Onset   Dementia Mother    Osteoporosis Mother    Lung cancer Father     Social History   Tobacco Use   Smoking status: Former   Smokeless tobacco: Never  Substance Use Topics   Alcohol use: Not Currently   Drug use: Not Currently     No Known Allergies  Review of Systems NEGATIVE UNLESS OTHERWISE INDICATED IN HPI      Objective:     BP  97/62 (BP Location: Left Arm)   Pulse 65   Temp 98.7 F (37.1 C) (Temporal)   Ht '5\' 2"'$  (1.575 m)   Wt 100 lb 9.6 oz (45.6 kg)   SpO2 99%   BMI 18.40 kg/m   Wt Readings from Last 3 Encounters:  02/11/22 100 lb 9.6 oz (45.6 kg)  11/12/21 101 lb 3.2 oz (45.9 kg)  05/14/21 114 lb 3.2 oz (51.8 kg)    BP Readings from Last 3 Encounters:  02/11/22 97/62  11/12/21 95/61  05/14/21 107/62     Physical Exam Vitals and nursing note reviewed.  Constitutional:      Appearance: Normal appearance. She is underweight. She is not ill-appearing, toxic-appearing or diaphoretic.  HENT:     Head: Normocephalic and atraumatic.     Right Ear: Tympanic membrane, ear canal and external ear normal.     Left Ear: Tympanic membrane, ear canal and external ear normal.     Nose: Nose normal.     Mouth/Throat:     Mouth: Mucous membranes are moist.  Eyes:     Extraocular Movements: Extraocular movements intact.     Conjunctiva/sclera: Conjunctivae normal.     Pupils: Pupils are equal, round, and reactive to light.  Cardiovascular:  Rate and Rhythm: Normal rate and regular rhythm.     Pulses: Normal pulses.     Heart sounds: Normal heart sounds.  Pulmonary:     Effort: Pulmonary effort is normal.     Breath sounds: Normal breath sounds.  Abdominal:     General: Abdomen is flat. Bowel sounds are normal.     Palpations: Abdomen is soft.  Musculoskeletal:        General: Normal range of motion.     Cervical back: Normal range of motion and neck supple.     Right lower leg: No edema.     Left lower leg: No edema.  Lymphadenopathy:     Head:     Right side of head: No submental, submandibular or tonsillar adenopathy.     Left side of head: No submental, submandibular or tonsillar adenopathy.     Cervical: No cervical adenopathy.     Upper Body:     Right upper body: No supraclavicular adenopathy.     Left upper body: No supraclavicular adenopathy.     Lower Body: No right inguinal  adenopathy. No left inguinal adenopathy.  Skin:    General: Skin is warm and dry.     Comments: Left superior shoulder subcutaneous mobile cyst not infected   Neurological:     General: No focal deficit present.     Mental Status: She is alert and oriented to person, place, and time.  Psychiatric:        Mood and Affect: Mood normal.        Behavior: Behavior normal.        Thought Content: Thought content normal.        Judgment: Judgment normal.        Assessment & Plan:   Problem List Items Addressed This Visit   None Visit Diagnoses     Abnormal weight loss    -  Primary   Relevant Orders   CBC with Differential/Platelet   Comprehensive metabolic panel   Hemoglobin A1c   TSH   DG Chest 2 View   Sedimentation rate   C-reactive protein   POCT Urinalysis Dipstick (Automated)   Former smoker       Relevant Orders   CBC with Differential/Platelet   Comprehensive metabolic panel   Hemoglobin A1c   TSH   DG Chest 2 View   Sedimentation rate   C-reactive protein   POCT Urinalysis Dipstick (Automated)   History of sebaceous cyst           1. Abnormal weight loss 2. Former smoker Unintentional, need work-up, pt agrees to labs and CXR today Hep C and HIV previously negative, will not repeat at this time Colonoscopy UTD Chest CT lung ca screening and mammo not UTD, also need pap updated Treat pending results, ongoing work-up, pt understands and agreeable  Consider CT chest / abd / pelvis if all results normal  3. History of sebaceous cyst Ok to schedule for procedure in office to remove at later date, Left shoulder    This note was prepared with assistance of Systems analyst. Occasional wrong-word or sound-a-like substitutions may have occurred due to the inherent limitations of voice recognition software.  Time Spent: 39 minutes of total time was spent on the date of the encounter performing the following actions: chart review prior to seeing  the patient, obtaining history, performing a medically necessary exam, counseling on the treatment plan, placing orders, and documenting in our EHR.  Momodou Consiglio M Danny Zimny, PA-C

## 2022-02-11 NOTE — Telephone Encounter (Signed)
Patient requesting refills on trazodone and Alprazolam 0.5 mg to be sent to [facility], scheduling 3 mon f/u for med check at front desk.

## 2022-02-11 NOTE — Patient Instructions (Addendum)
Good to see you!   Update labs & urinalysis - treat pending results.  Need to update Chest XRAY at [facility].  Pending normal labs / XRAY - next steps in work-up do include CT chest / abdomen /pelvis  Call to update mammogram.  Happy to update pap in office at later date.   Check with your insurance about coverage for cyst removal and we can schedule for procedure in office later.

## 2022-02-14 ENCOUNTER — Other Ambulatory Visit: Payer: Self-pay | Admitting: Physician Assistant

## 2022-02-14 DIAGNOSIS — Z87891 Personal history of nicotine dependence: Secondary | ICD-10-CM

## 2022-02-14 DIAGNOSIS — R634 Abnormal weight loss: Secondary | ICD-10-CM

## 2022-02-25 ENCOUNTER — Inpatient Hospital Stay: Admission: RE | Admit: 2022-02-25 | Payer: Commercial Managed Care - HMO | Source: Ambulatory Visit

## 2022-02-28 ENCOUNTER — Telehealth: Payer: Self-pay | Admitting: Physician Assistant

## 2022-02-28 NOTE — Telephone Encounter (Signed)
Aileen from [place] called in regards to a PA for patient. States she will be sending the PA via fax today. She reports that Linus Galas may reach out if needed in regards to this at [phone], option #4. The case number is [id number].

## 2022-03-09 ENCOUNTER — Other Ambulatory Visit: Payer: Self-pay | Admitting: Physician Assistant

## 2022-03-09 DIAGNOSIS — R634 Abnormal weight loss: Secondary | ICD-10-CM

## 2022-03-09 DIAGNOSIS — Z87891 Personal history of nicotine dependence: Secondary | ICD-10-CM

## 2022-03-22 ENCOUNTER — Other Ambulatory Visit: Payer: Self-pay

## 2022-03-22 ENCOUNTER — Other Ambulatory Visit: Payer: Self-pay | Admitting: Physician Assistant

## 2022-03-22 DIAGNOSIS — R634 Abnormal weight loss: Secondary | ICD-10-CM

## 2022-03-22 DIAGNOSIS — Z87891 Personal history of nicotine dependence: Secondary | ICD-10-CM

## 2022-03-23 ENCOUNTER — Other Ambulatory Visit: Payer: Self-pay | Admitting: Physician Assistant

## 2022-03-23 DIAGNOSIS — R634 Abnormal weight loss: Secondary | ICD-10-CM

## 2022-03-23 DIAGNOSIS — Z87891 Personal history of nicotine dependence: Secondary | ICD-10-CM

## 2022-03-25 ENCOUNTER — Inpatient Hospital Stay: Admission: RE | Admit: 2022-03-25 | Payer: Commercial Managed Care - HMO | Source: Ambulatory Visit

## 2022-03-25 ENCOUNTER — Other Ambulatory Visit: Payer: Commercial Managed Care - HMO

## 2022-05-13 ENCOUNTER — Ambulatory Visit (INDEPENDENT_AMBULATORY_CARE_PROVIDER_SITE_OTHER): Payer: Commercial Managed Care - HMO | Admitting: Physician Assistant

## 2022-05-13 ENCOUNTER — Encounter: Payer: Self-pay | Admitting: Physician Assistant

## 2022-05-13 ENCOUNTER — Other Ambulatory Visit: Payer: Self-pay | Admitting: Physician Assistant

## 2022-05-13 ENCOUNTER — Other Ambulatory Visit (HOSPITAL_COMMUNITY)
Admission: RE | Admit: 2022-05-13 | Discharge: 2022-05-13 | Disposition: A | Discharge status: Home / Self Care | Payer: Commercial Managed Care - HMO | Source: Ambulatory Visit | Attending: Physician Assistant | Admitting: Physician Assistant

## 2022-05-13 VITALS — BP 107/64 | HR 60 | Temp 97.9°F | Ht 62.0 in | Wt 103.6 lb

## 2022-05-13 DIAGNOSIS — Z0001 Encounter for general adult medical examination with abnormal findings: Secondary | ICD-10-CM | POA: Diagnosis not present

## 2022-05-13 DIAGNOSIS — Z01419 Encounter for gynecological examination (general) (routine) without abnormal findings: Secondary | ICD-10-CM

## 2022-05-13 DIAGNOSIS — D485 Neoplasm of uncertain behavior of skin: Secondary | ICD-10-CM

## 2022-05-13 DIAGNOSIS — C44619 Basal cell carcinoma of skin of left upper limb, including shoulder: Secondary | ICD-10-CM | POA: Diagnosis not present

## 2022-05-13 DIAGNOSIS — D2262 Melanocytic nevi of left upper limb, including shoulder: Secondary | ICD-10-CM | POA: Diagnosis not present

## 2022-05-13 DIAGNOSIS — L723 Sebaceous cyst: Secondary | ICD-10-CM | POA: Diagnosis not present

## 2022-05-13 DIAGNOSIS — F411 Generalized anxiety disorder: Secondary | ICD-10-CM

## 2022-05-13 DIAGNOSIS — F419 Anxiety disorder, unspecified: Secondary | ICD-10-CM | POA: Insufficient documentation

## 2022-05-13 MED ORDER — TRAZODONE HCL 100 MG PO TABS: 100.0000 mg | ORAL_TABLET | Freq: Every evening | ORAL | 5 refills | Status: DC | PRN

## 2022-05-13 MED ORDER — ALPRAZOLAM 0.5 MG PO TABS: 0.5000 mg | ORAL_TABLET | Freq: Two times a day (BID) | ORAL | 0 refills | Status: DC | PRN

## 2022-05-13 NOTE — Progress Notes (Signed)
Subjective:    Patient ID: Ashley [name], female    DOB: September 26, 1960, 61 y.o.   MRN: [id number]  Chief Complaint  Patient presents with   Gynecologic Exam    Pt had no questions or concerns !   Medication Refill    HPI Patient is in today for regular female wellness exam. Can't remember the last pap she had. Denies any lesions, discharge, pain, or bleeding. Plans to update her mammogram soon. Says she has been doing her best to stay healthy recently. Still chasing around 62 yo and 92 yo grandkids and loving it.   Also wondering about a few skin areas of concern and needing meds refilled.   Past Medical History:  Diagnosis Date   Coma ([place])    medically induced   COVID-19     History reviewed. No pertinent surgical history.  Family History  Problem Relation Age of Onset   Dementia Mother    Osteoporosis Mother    Lung cancer Father     Social History   Tobacco Use   Smoking status: Former   Smokeless tobacco: Never  Substance Use Topics   Alcohol use: Not Currently   Drug use: Not Currently     No Known Allergies  Review of Systems NEGATIVE UNLESS OTHERWISE INDICATED IN HPI      Objective:     BP 107/64 (BP Location: Left Arm, Patient Position: Sitting)   Pulse 60   Temp 97.9 F (36.6 C) (Temporal)   Ht '5\' 2"'$  (1.575 m)   Wt 103 lb 9.6 oz (47 kg)   SpO2 98%   BMI 18.95 kg/m   Wt Readings from Last 3 Encounters:  05/13/22 103 lb 9.6 oz (47 kg)  02/11/22 100 lb 9.6 oz (45.6 kg)  11/12/21 101 lb 3.2 oz (45.9 kg)    BP Readings from Last 3 Encounters:  05/13/22 107/64  02/11/22 97/62  11/12/21 95/61     Physical Exam Vitals and nursing note reviewed. Exam conducted with a chaperone present.  Constitutional:      Appearance: Normal appearance. She is underweight. She is not ill-appearing, toxic-appearing or diaphoretic.  HENT:     Head: Normocephalic and atraumatic.     Right Ear: External ear normal.     Left Ear: External ear normal.      Nose: Nose normal.     Mouth/Throat:     Mouth: Mucous membranes are moist.  Eyes:     Extraocular Movements: Extraocular movements intact.     Conjunctiva/sclera: Conjunctivae normal.     Pupils: Pupils are equal, round, and reactive to light.  Cardiovascular:     Rate and Rhythm: Normal rate and regular rhythm.     Pulses: Normal pulses.     Heart sounds: Normal heart sounds.  Pulmonary:     Effort: Pulmonary effort is normal.     Breath sounds: Normal breath sounds.  Chest:  Breasts:    Right: Normal. No bleeding, inverted nipple, mass, nipple discharge or skin change.     Left: Normal. No bleeding, inverted nipple, mass, nipple discharge or skin change.  Abdominal:     General: Abdomen is flat. Bowel sounds are normal.     Palpations: Abdomen is soft.  Genitourinary:    Labia:        Right: No rash.        Left: No rash.      Vagina: Normal.     Cervix: Normal.     Uterus: Normal.  Adnexa: Right adnexa normal and left adnexa normal.  Musculoskeletal:        General: Normal range of motion.     Cervical back: Normal range of motion and neck supple.     Right lower leg: No edema.     Left lower leg: No edema.  Lymphadenopathy:     Head:     Right side of head: No submental, submandibular or tonsillar adenopathy.     Left side of head: No submental, submandibular or tonsillar adenopathy.     Cervical: No cervical adenopathy.     Upper Body:     Right upper body: No supraclavicular adenopathy.     Left upper body: No supraclavicular adenopathy.     Lower Body: No right inguinal adenopathy. No left inguinal adenopathy.  Skin:    General: Skin is warm and dry.     Comments: Left superior shoulder subcutaneous mobile cyst not infected . Two raised skin lesions left upper arm - more proximal is pink /flesh colored; more distal lesion is oval, raised, pink / purple color.  Neurological:     General: No focal deficit present.     Mental Status: She is alert and oriented  to person, place, and time.  Psychiatric:        Mood and Affect: Mood normal.        Behavior: Behavior normal.        Assessment & Plan:  Well woman exam with routine gynecological exam Assessment & Plan: Age-appropriate screening and counseling performed today.  Preventive measures discussed and printed in AVS for patient. She will schedule for mammo. Pap updated today. Continuing to monitor weight - up three lbs, great news.  Patient Counseling: '[x]'$   Nutrition: Stressed importance of moderation in sodium/caffeine intake, saturated fat and cholesterol, caloric balance, sufficient intake of fresh fruits, vegetables, and fiber.  '[x]'$   Stressed the importance of regular exercise.   '[]'$   Substance Abuse: Discussed cessation/primary prevention of tobacco, alcohol, or other drug use; driving or other dangerous activities under the influence; availability of treatment for abuse.   '[]'$   Injury prevention: Discussed safety belts, safety helmets, smoke detector, smoking near bedding or upholstery.   '[]'$   Sexuality: Discussed sexually transmitted diseases, partner selection, use of condoms, avoidance of unintended pregnancy  and contraceptive alternatives.   '[x]'$   Dental health: Discussed importance of regular tooth brushing, flossing, and dental visits.  '[x]'$   Health maintenance and immunizations reviewed. Please refer to Health maintenance section.       Orders: -     Cytology - PAP  GAD (generalized anxiety disorder) Assessment & Plan: Stable PDMP reviewed today, no red flags, filling appropriately.  Refilled Trazodone and Xanax today    Sebaceous cyst  Neoplasm of uncertain behavior of skin -     Anatomic Pathology Report -     Anatomic Pathology Report  Other orders -     traZODone HCl; Take 1 tablet (100 mg total) by mouth at bedtime as needed for sleep.  Dispense: 30 tablet; Refill: 5 -     ALPRAZolam; Take 1 tablet (0.5 mg total) by mouth 2 (two) times daily as needed for  anxiety.  Dispense: 60 tablet; Refill: 0   PROCEDURES:  Left shoulder sebaceous cyst: PROCEDURE NOTE: I&D of Abscess Verbal consent obtained. Local anesthesia with 2cc of 2% lidocaine with epi. Site cleansed with alcohol. Incision of 27m cross shaped was made using an 11 blade, sebum odorous material expressed. Cleansed and dressed. Bleeding  controlled. Pt tolerated well. Bandage applied.  Left upper arm proximal lesion shave biopsy: Procedure explained and consent obtained. Area cleaned in usual manner. Local anesthesia with 1 cc of Lidocaine 2% with Epi, using a 25 gauge needle. Dermablade used to remove the lesion. Hemostasis with Drysol. Band-Aid with Vaseline applied. Patient tolerated procedure well. Aftercare instructions provided. Lesion sent for pathology.   Left upper arm distal lesion shave biopsy: Procedure explained and consent obtained. Area cleaned in usual manner. Local anesthesia with 1 cc of Lidocaine 1% with Epi, using a 25 gauge needle. Dermablade used to remove the lesion. Hemostasis with Drysol. Band-Aid with Vaseline applied. Patient tolerated procedure well. Aftercare instructions provided. Lesion sent for pathology.      Return in about 6 months (around 11/11/2022) for recheck.    Zoran Yankee M Maylea Soria, PA-C

## 2022-05-13 NOTE — Assessment & Plan Note (Signed)
Stable PDMP reviewed today, no red flags, filling appropriately.  Refilled Trazodone and Xanax today

## 2022-05-13 NOTE — Assessment & Plan Note (Signed)
Age-appropriate screening and counseling performed today.  Preventive measures discussed and printed in AVS for patient. She will schedule for mammo. Pap updated today. Continuing to monitor weight - up three lbs, great news.  Patient Counseling: '[x]'$   Nutrition: Stressed importance of moderation in sodium/caffeine intake, saturated fat and cholesterol, caloric balance, sufficient intake of fresh fruits, vegetables, and fiber.  '[x]'$   Stressed the importance of regular exercise.   '[]'$   Substance Abuse: Discussed cessation/primary prevention of tobacco, alcohol, or other drug use; driving or other dangerous activities under the influence; availability of treatment for abuse.   '[]'$   Injury prevention: Discussed safety belts, safety helmets, smoke detector, smoking near bedding or upholstery.   '[]'$   Sexuality: Discussed sexually transmitted diseases, partner selection, use of condoms, avoidance of unintended pregnancy  and contraceptive alternatives.   '[x]'$   Dental health: Discussed importance of regular tooth brushing, flossing, and dental visits.  '[x]'$   Health maintenance and immunizations reviewed. Please refer to Health maintenance section.

## 2022-05-17 LAB — CYTOLOGY - PAP
Comment: NEGATIVE
Diagnosis: NEGATIVE
High risk HPV: NEGATIVE

## 2022-05-18 LAB — ANATOMIC PATHOLOGY REPORT

## 2022-05-18 LAB — SPECIMEN STATUS REPORT

## 2022-05-19 ENCOUNTER — Other Ambulatory Visit: Payer: Self-pay

## 2022-05-19 DIAGNOSIS — C4491 Basal cell carcinoma of skin, unspecified: Secondary | ICD-10-CM

## 2022-06-06 ENCOUNTER — Telehealth: Payer: Self-pay | Admitting: Physician Assistant

## 2022-06-06 NOTE — Telephone Encounter (Signed)
Returned [place] call and she was at lunch, left message with receptionist that pathology report requested was faxed over to number requested.

## 2022-06-06 NOTE — Telephone Encounter (Signed)
Caller is [facility]   Caller states: - She is missing pathology report for patient  - Pathology reports should be either from 05/19/22 OV or sometime prior to 05/19/22  Caller requests: - Pathology report be faxed to [phone] with the note of "Attention Sonia Baller"  For further questions, Sonia Baller can be reached at [phone].

## 2022-06-22 ENCOUNTER — Other Ambulatory Visit: Payer: Self-pay | Admitting: Physician Assistant

## 2022-08-11 ENCOUNTER — Encounter: Payer: Self-pay | Admitting: Physician Assistant

## 2022-10-03 ENCOUNTER — Other Ambulatory Visit: Payer: Self-pay | Admitting: Physician Assistant

## 2022-10-03 NOTE — Telephone Encounter (Signed)
Last OV: 05/13/22  Next OV: 11/11/22  Last filled: 06/22/22  Quantity: 60 w/ 2 refills

## 2022-11-11 ENCOUNTER — Ambulatory Visit (INDEPENDENT_AMBULATORY_CARE_PROVIDER_SITE_OTHER): Payer: 59 | Admitting: Physician Assistant

## 2022-11-11 VITALS — BP 94/62 | HR 75 | Temp 98.0°F | Ht 62.0 in | Wt 107.6 lb

## 2022-11-11 DIAGNOSIS — G4700 Insomnia, unspecified: Secondary | ICD-10-CM | POA: Insufficient documentation

## 2022-11-11 DIAGNOSIS — F411 Generalized anxiety disorder: Secondary | ICD-10-CM | POA: Diagnosis not present

## 2022-11-11 MED ORDER — ALPRAZOLAM 0.5 MG PO TABS: ORAL_TABLET | ORAL | 2 refills | Status: DC

## 2022-11-11 MED ORDER — TRAZODONE HCL 100 MG PO TABS: 100.0000 mg | ORAL_TABLET | Freq: Every evening | ORAL | 5 refills | Status: DC | PRN

## 2022-11-11 NOTE — Assessment & Plan Note (Signed)
Stable, well-controlled PDMP reviewed today, no red flags, filling appropriately.  Refilled Xanax 0.5 mg to take 1-2 tab daily prn anxiety.

## 2022-11-11 NOTE — Assessment & Plan Note (Signed)
Some worsening symptoms in last month. Can increase trazodone to 150 mg, can also add melatonin OTC 3 hours prior to bedtime. Sleep hygiene in AVS.

## 2022-11-11 NOTE — Patient Instructions (Signed)
Please work on the following to help with sleep:  -Sleep only long enough to feel rested then get out of bed -Go to bed and get up at the same time every day. -Do not try to force yourself to sleep. If you can't sleep, get out of bed and try again later. -Have coffee, tea, and other foods that have caffeine only in the morning. -Avoid alcohol -Keep your bedroom dark, cool, quiet, and free of reminders of work or other things that cause you stress -Exercise several days a week, but not right before bed -Avoid looking at phones or reading devices ("e-books") that give off light before bed. This can make it harder to fall asleep -See the morning sun as soon as possible; also be outdoors if possible during peak daylight hours  -Melatonin OTC 3-4 hours prior to bedtime.

## 2022-11-11 NOTE — Progress Notes (Signed)
Subjective:    Patient ID: Ashley [name], female    DOB: March 27, 1961, 62 y.o.   MRN: [id number]  Chief Complaint  Patient presents with   Follow-up    Pt in office for 6 mon f/u and recheck; pt states no concerns to discuss and things are going well since last visit; pt is going to need refills of Xanax and Trazodone; pt has contact info to call and schedule mammogram;     HPI Patient is in today for 6 month f/up.  Takes Trazodone 100 mg every night. Waking up at 3 am to urinate, then can't fall back to sleep. Otherwise doing well.  Anxiety well controlled.   No new concerns.   Past Medical History:  Diagnosis Date   Coma ([place])    medically induced   COVID-19     No past surgical history on file.  Family History  Problem Relation Age of Onset   Dementia Mother    Osteoporosis Mother    Lung cancer Father     Social History   Tobacco Use   Smoking status: Former   Smokeless tobacco: Never  Substance Use Topics   Alcohol use: Not Currently   Drug use: Not Currently     No Known Allergies  Review of Systems NEGATIVE UNLESS OTHERWISE INDICATED IN HPI      Objective:     BP 94/62 (BP Location: Left Arm)   Pulse 75   Temp 98 F (36.7 C) (Temporal)   Ht 5\' 2"  (1.575 m)   Wt 107 lb 9.6 oz (48.8 kg)   SpO2 98%   BMI 19.68 kg/m   Wt Readings from Last 3 Encounters:  11/11/22 107 lb 9.6 oz (48.8 kg)  05/13/22 103 lb 9.6 oz (47 kg)  02/11/22 100 lb 9.6 oz (45.6 kg)    BP Readings from Last 3 Encounters:  11/11/22 94/62  05/13/22 107/64  02/11/22 97/62     Physical Exam Vitals and nursing note reviewed.  Constitutional:      Appearance: Normal appearance.  Cardiovascular:     Rate and Rhythm: Normal rate and regular rhythm.     Pulses: Normal pulses.  Pulmonary:     Effort: Pulmonary effort is normal.     Breath sounds: Normal breath sounds.  Neurological:     General: No focal deficit present.     Mental Status: She is alert and oriented  to person, place, and time.  Psychiatric:        Mood and Affect: Mood normal.        Behavior: Behavior normal.        Thought Content: Thought content normal.        Judgment: Judgment normal.        Assessment & Plan:  GAD (generalized anxiety disorder) Assessment & Plan: Stable, well-controlled PDMP reviewed today, no red flags, filling appropriately.  Refilled Xanax 0.5 mg to take 1-2 tab daily prn anxiety.    Insomnia, unspecified type Assessment & Plan: Some worsening symptoms in last month. Can increase trazodone to 150 mg, can also add melatonin OTC 3 hours prior to bedtime. Sleep hygiene in AVS.    Other orders -     traZODone HCl; Take 1 tablet (100 mg total) by mouth at bedtime as needed for sleep.  Dispense: 30 tablet; Refill: 5 -     ALPRAZolam; TAKE 1 TABLET BY MOUTH TWICE A DAY AS NEEDED FOR ANXIETY  Dispense: 60 tablet; Refill: 2  Return in about 6 months (around 05/14/2023) for CPE, fasting labs .    Tiffaney Heimann M Serena Petterson, PA-C

## 2023-01-30 ENCOUNTER — Other Ambulatory Visit: Payer: Self-pay | Admitting: Physician Assistant

## 2023-01-30 DIAGNOSIS — Z1231 Encounter for screening mammogram for malignant neoplasm of breast: Secondary | ICD-10-CM

## 2023-02-24 ENCOUNTER — Ambulatory Visit
Admission: RE | Admit: 2023-02-24 | Discharge: 2023-02-24 | Disposition: A | Discharge status: Home / Self Care | Payer: 59 | Source: Ambulatory Visit | Attending: Physician Assistant | Admitting: Physician Assistant

## 2023-02-24 DIAGNOSIS — Z1231 Encounter for screening mammogram for malignant neoplasm of breast: Secondary | ICD-10-CM

## 2023-02-28 ENCOUNTER — Other Ambulatory Visit: Payer: Self-pay

## 2023-02-28 DIAGNOSIS — Z780 Asymptomatic menopausal state: Secondary | ICD-10-CM

## 2023-04-13 ENCOUNTER — Encounter (INDEPENDENT_AMBULATORY_CARE_PROVIDER_SITE_OTHER): Payer: Self-pay

## 2023-04-19 ENCOUNTER — Other Ambulatory Visit: Payer: Self-pay | Admitting: Physician Assistant

## 2023-04-19 NOTE — Telephone Encounter (Signed)
Last visit: 11/11/2022  Next visit: 05/19/23  Last filled: 11/11/22  Quantity: 60 w/ 2 refills

## 2023-05-16 ENCOUNTER — Other Ambulatory Visit: Payer: Self-pay | Admitting: Physician Assistant

## 2023-05-19 ENCOUNTER — Ambulatory Visit (INDEPENDENT_AMBULATORY_CARE_PROVIDER_SITE_OTHER): Payer: 59 | Admitting: Physician Assistant

## 2023-05-19 ENCOUNTER — Encounter: Payer: Self-pay | Admitting: Physician Assistant

## 2023-05-19 VITALS — BP 100/60 | HR 69 | Temp 97.7°F | Ht 62.25 in | Wt 102.0 lb

## 2023-05-19 DIAGNOSIS — I491 Atrial premature depolarization: Secondary | ICD-10-CM

## 2023-05-19 DIAGNOSIS — Z0000 Encounter for general adult medical examination without abnormal findings: Secondary | ICD-10-CM

## 2023-05-19 DIAGNOSIS — G4700 Insomnia, unspecified: Secondary | ICD-10-CM | POA: Diagnosis not present

## 2023-05-19 DIAGNOSIS — Z780 Asymptomatic menopausal state: Secondary | ICD-10-CM | POA: Diagnosis not present

## 2023-05-19 DIAGNOSIS — F411 Generalized anxiety disorder: Secondary | ICD-10-CM

## 2023-05-19 LAB — COMPREHENSIVE METABOLIC PANEL
ALT: 15 U/L (ref 0–35)
AST: 22 U/L (ref 0–37)
Albumin: 4.2 g/dL (ref 3.5–5.2)
Alkaline Phosphatase: 73 U/L (ref 39–117)
BUN: 10 mg/dL (ref 6–23)
CO2: 31 mEq/L (ref 19–32)
Calcium: 9.5 mg/dL (ref 8.4–10.5)
Chloride: 99 mEq/L (ref 96–112)
Creatinine, Ser: 0.75 mg/dL (ref 0.40–1.20)
GFR: 84.93 mL/min (ref >60.00)
Glucose, Bld: 82 mg/dL (ref 70–99)
Potassium: 3.5 mEq/L (ref 3.5–5.1)
Sodium: 137 mEq/L (ref 135–145)
Total Bilirubin: 0.6 mg/dL (ref 0.2–1.2)
Total Protein: 6.7 g/dL (ref 6.0–8.3)

## 2023-05-19 LAB — LIPID PANEL
Cholesterol: 192 mg/dL (ref 0–200)
HDL: 92.6 mg/dL (ref >39.00)
LDL Cholesterol: 87 mg/dL (ref 0–99)
NonHDL: 99.17
Total CHOL/HDL Ratio: 2
Triglycerides: 62 mg/dL (ref 0.0–149.0)
VLDL: 12.4 mg/dL (ref 0.0–40.0)

## 2023-05-19 LAB — CBC WITH DIFFERENTIAL/PLATELET
Basophils Absolute: 0.1 10*3/uL (ref 0.0–0.1)
Basophils Relative: 0.8 % (ref 0.0–3.0)
Eosinophils Absolute: 0.2 10*3/uL (ref 0.0–0.7)
Eosinophils Relative: 2.4 % (ref 0.0–5.0)
HCT: 42.2 % (ref 36.0–46.0)
Hemoglobin: 13.8 g/dL (ref 12.0–15.0)
Lymphocytes Relative: 29.2 % (ref 12.0–46.0)
Lymphs Abs: 2.5 10*3/uL (ref 0.7–4.0)
MCHC: 32.7 g/dL (ref 30.0–36.0)
MCV: 94.4 fl (ref 78.0–100.0)
Monocytes Absolute: 0.5 10*3/uL (ref 0.1–1.0)
Monocytes Relative: 5.5 % (ref 3.0–12.0)
Neutro Abs: 5.2 10*3/uL (ref 1.4–7.7)
Neutrophils Relative %: 62.1 % (ref 43.0–77.0)
Platelets: 221 10*3/uL (ref 150.0–400.0)
RBC: 4.47 Mil/uL (ref 3.87–5.11)
RDW: 13.2 % (ref 11.5–15.5)
WBC: 8.4 10*3/uL (ref 4.0–10.5)

## 2023-05-19 LAB — TSH: TSH: 1.66 u[IU]/mL (ref 0.35–5.50)

## 2023-05-19 LAB — HEMOGLOBIN A1C: Hgb A1c MFr Bld: 5.3 % (ref 4.6–6.5)

## 2023-05-19 MED ORDER — TRAZODONE HCL 100 MG PO TABS
100.0000 mg | ORAL_TABLET | Freq: Every evening | ORAL | 5 refills | Status: DC | PRN
Start: 2023-05-19 — End: 2023-11-13

## 2023-05-19 MED ORDER — ALPRAZOLAM 0.5 MG PO TABS
ORAL_TABLET | ORAL | 2 refills | Status: DC
Start: 2023-05-19 — End: 2023-11-17

## 2023-05-19 NOTE — Progress Notes (Signed)
Subjective:    Patient ID: Ashley [name], female    DOB: 09/11/1960, 62 y.o.   MRN: [id number]  Chief Complaint  Patient presents with   Annual Exam    HPI Patient is in today for annual exam. Feels good. Excited for two new grandchildren on the way.   Acute concerns: None  Health maintenance: Lifestyle/ exercise: Walking, chasing around grandkids  Nutrition: Low appetite, eats mostly dinner and snacks otherwise  Mental health: doing well  Sleep: Trazodone and Alprazolam as needed at night helps  Substance use: none  Immunizations: declines  Colonoscopy: UTD Pap: UTD Mammogram: UTD DEXA: ordered     Past Medical History:  Diagnosis Date   Coma (HCC)    medically induced   COVID-19     No past surgical history on file.  Family History  Problem Relation Age of Onset   Dementia Mother    Osteoporosis Mother    Lung cancer Father     Social History   Tobacco Use   Smoking status: Former    Types: Cigarettes   Smokeless tobacco: Never  Substance Use Topics   Alcohol use: Not Currently   Drug use: Not Currently     Allergies  Allergen Reactions   Escitalopram     Other Reaction(s): felt funny   Fluoxetine     Other Reaction(s): felt funny   Varenicline     Other Reaction(s): depression, insomnia    Review of Systems NEGATIVE UNLESS OTHERWISE INDICATED IN HPI      Objective:     BP 100/60 (BP Location: Left Arm, Patient Position: Sitting, Cuff Size: Normal)   Pulse 69   Temp 97.7 F (36.5 C) (Temporal)   Ht 5' 2.25" (1.581 m)   Wt 102 lb (46.3 kg)   SpO2 98%   BMI 18.51 kg/m   Wt Readings from Last 3 Encounters:  05/19/23 102 lb (46.3 kg)  11/11/22 107 lb 9.6 oz (48.8 kg)  05/13/22 103 lb 9.6 oz (47 kg)    BP Readings from Last 3 Encounters:  05/19/23 100/60  11/11/22 94/62  05/13/22 107/64     Physical Exam Vitals and nursing note reviewed.  Constitutional:      Appearance: Normal appearance. She is normal weight. She is  not toxic-appearing.  HENT:     Head: Normocephalic and atraumatic.     Right Ear: Tympanic membrane, ear canal and external ear normal.     Left Ear: Tympanic membrane, ear canal and external ear normal.     Nose: Nose normal.     Mouth/Throat:     Mouth: Mucous membranes are moist.  Eyes:     Extraocular Movements: Extraocular movements intact.     Conjunctiva/sclera: Conjunctivae normal.     Pupils: Pupils are equal, round, and reactive to light.  Cardiovascular:     Rate and Rhythm: Normal rate. Rhythm irregular.     Pulses: Normal pulses.     Heart sounds: Murmur (RUSB previously known murmur) heard.  Pulmonary:     Effort: Pulmonary effort is normal.     Breath sounds: Normal breath sounds.  Abdominal:     General: Abdomen is flat. Bowel sounds are normal.     Palpations: Abdomen is soft.  Musculoskeletal:        General: Normal range of motion.     Cervical back: Normal range of motion and neck supple.  Skin:    General: Skin is warm and dry.  Neurological:  General: No focal deficit present.     Mental Status: She is alert and oriented to person, place, and time.  Psychiatric:        Mood and Affect: Mood normal.        Behavior: Behavior normal.        Thought Content: Thought content normal.        Judgment: Judgment normal.        Assessment & Plan:  Encounter for annual physical exam -     Lipid panel -     Comprehensive metabolic panel -     CBC with Differential/Platelet -     Hemoglobin A1c -     TSH  Postmenopausal -     DG Bone Density; Future  PAC (premature atrial contraction) Assessment & Plan: I personally reviewed her ECG today. NSR with frequent PACs. No signs of ischemia. Pt is asymptomatic. Will monitor at this time.   Orders: -     EKG 12-Lead  GAD (generalized anxiety disorder) Assessment & Plan: Stable, well-controlled PDMP reviewed today, no red flags, filling appropriately.  Refilled Xanax 0.5 mg to take 1-2 tab daily prn  anxiety.   Orders: -     ALPRAZolam; TAKE 1 TABLET BY MOUTH TWICE A DAY AS NEEDED FOR ANXIETY  Dispense: 60 tablet; Refill: 2  Insomnia, unspecified type Assessment & Plan: Stable on Trazodone Sleep hygiene in AVS.   Orders: -     traZODone HCl; Take 1 tablet (100 mg total) by mouth at bedtime as needed for sleep.  Dispense: 30 tablet; Refill: 5   Age-appropriate screening and counseling performed today. Will check labs and call with results. Preventive measures discussed and printed in AVS for patient.   Patient Counseling: [x]   Nutrition: Stressed importance of moderation in sodium/caffeine intake, saturated fat and cholesterol, caloric balance, sufficient intake of fresh fruits, vegetables, and fiber.  [x]   Stressed the importance of regular exercise.   []   Substance Abuse: Discussed cessation/primary prevention of tobacco, alcohol, or other drug use; driving or other dangerous activities under the influence; availability of treatment for abuse.   []   Injury prevention: Discussed safety belts, safety helmets, smoke detector, smoking near bedding or upholstery.   []   Sexuality: Discussed sexually transmitted diseases, partner selection, use of condoms, avoidance of unintended pregnancy  and contraceptive alternatives.   [x]   Dental health: Discussed importance of regular tooth brushing, flossing, and dental visits.  [x]   Health maintenance and immunizations reviewed. Please refer to Health maintenance section.        Return in about 6 months (around 11/16/2023) for recheck/follow-up.     Deb Loudin M Calee Nugent, PA-C

## 2023-05-19 NOTE — Assessment & Plan Note (Signed)
Stable, well-controlled PDMP reviewed today, no red flags, filling appropriately.  Refilled Xanax 0.5 mg to take 1-2 tab daily prn anxiety.

## 2023-05-19 NOTE — Assessment & Plan Note (Signed)
I personally reviewed her ECG today. NSR with frequent PACs. No signs of ischemia. Pt is asymptomatic. Will monitor at this time.

## 2023-05-19 NOTE — Assessment & Plan Note (Signed)
Stable on Trazodone Sleep hygiene in AVS.

## 2023-11-11 ENCOUNTER — Other Ambulatory Visit: Payer: Self-pay | Admitting: Physician Assistant

## 2023-11-11 DIAGNOSIS — G4700 Insomnia, unspecified: Secondary | ICD-10-CM

## 2023-11-13 NOTE — Telephone Encounter (Signed)
 Last office visit:05/19/2023 Next:n/a Quantity:30

## 2023-11-17 ENCOUNTER — Ambulatory Visit: Payer: 59 | Admitting: Physician Assistant

## 2023-11-17 VITALS — BP 104/66 | HR 72 | Temp 97.8°F | Ht 62.25 in | Wt 103.0 lb

## 2023-11-17 DIAGNOSIS — R011 Cardiac murmur, unspecified: Secondary | ICD-10-CM

## 2023-11-17 DIAGNOSIS — F411 Generalized anxiety disorder: Secondary | ICD-10-CM

## 2023-11-17 DIAGNOSIS — G4700 Insomnia, unspecified: Secondary | ICD-10-CM

## 2023-11-17 DIAGNOSIS — D485 Neoplasm of uncertain behavior of skin: Secondary | ICD-10-CM

## 2023-11-17 MED ORDER — AMOXICILLIN 500 MG PO CAPS: 500.0000 mg | ORAL_CAPSULE | Freq: Three times a day (TID) | ORAL | 0 refills | Status: AC

## 2023-11-17 MED ORDER — ALPRAZOLAM 0.5 MG PO TABS
ORAL_TABLET | ORAL | 2 refills | Status: AC
Start: 2023-11-17 — End: ?

## 2023-11-17 NOTE — Progress Notes (Signed)
 Patient ID: Ashley [name], female    DOB: [birth year], 63 y.o.   MRN: [id number]   Assessment & Plan:  GAD (generalized anxiety disorder) -     ALPRAZolam; TAKE 1 TABLET BY MOUTH TWICE A DAY AS NEEDED FOR ANXIETY  Dispense: 60 tablet; Refill: 2  Insomnia, unspecified type  Murmur, cardiac -     ECHOCARDIOGRAM COMPLETE; Future  Neoplasm of uncertain behavior of skin  Other orders -     Amoxicillin; Take 1 capsule (500 mg total) by mouth 3 (three) times daily for 7 days.  Dispense: 21 capsule; Refill: 0      Assessment and Plan Assessment & Plan Skin lesion on left lower leg Newly developed red, scaly spot on the left lower leg, noticed last week. Previous mole removal with no recent dermatology follow-up. Differential includes possible skin cancer given history and fair skin. Discussed potential management options including biopsy or cryotherapy, depending on dermatology evaluation. - Refer to dermatology for evaluation of the skin lesion - Provide [facility] for potential [facility]ited appointment - Recommend annual skin screenings due to fair skin and history of skin lesions  Heart murmur Previously confirmed heart murmur on examination. No history of heart ultrasound. Discussed the benign nature of many murmurs but recommended further evaluation to assess heart valves and structure.  - Order heart ultrasound to evaluate heart valves and structure  Dental procedure prophylaxis Upcoming dental deep cleaning with heart murmur. Previous prophylactic antibiotic use before dental procedures due to murmur. Confirmed presence of murmur and prescribed amoxicillin for prophylaxis. - Prescribe amoxicillin for dental procedure prophylaxis - Advise to start antibiotic over the weekend before Monday procedure  Anxiety Chronic anxiety, well-managed on Xanax taken twice daily for over ten years. Recent increase in anxiety due to aging and world events, but  overall well-managed. Discussed normalcy of feelings and coping strategies, including mindfulness and gratitude practices. - Refill Xanax prescription with 30-day supply and two refills PDMP reviewed today, no red flags, filling appropriately.  - Encourage mindfulness and limiting exposure to distressing news - Offer referral to in-house psychologist, Dr. Monna Fam, if needed      Return in about 6 months (around 05/19/2024) for physical, fasting labs .    Subjective:    Chief Complaint  Patient presents with   Anxiety    Anxiety     Discussed the use of AI scribe software for clinical note transcription with the patient, who gave verbal consent to proceed.  History of Present Illness Ashley [name] is a 63 year old female who presents with a new red, scaly spot on her left lower leg.  She has a new red, scaly spot on her left lower leg that appeared suddenly last week. She is concerned it might be related to her history of skin issues, including a previous mole removal. She has not had any recent dermatology consultations and is not frequently exposed to the sun. Details of her last dermatology visit are unclear.  She has a history of a heart murmur first noted in the early 2000s. She has not undergone a heart ultrasound but has taken amoxicillin before dental procedures as a precaution. No chest pain or other cardiac symptoms are reported.  She has been taking Xanax twice daily for over ten years to manage her anxiety, which she describes as stable. However, she notes increased anxiety recently due to thoughts about aging and world events. Babysitting her grandchildren weekly is both rewarding and  challenging.     Past Medical History:  Diagnosis Date   Coma (HCC)    medically induced   COVID-19     No past surgical history on file.  Family History  Problem Relation Age of Onset   Dementia Mother    Osteoporosis Mother    Lung cancer Father     Social History    Tobacco Use   Smoking status: Former    Types: Cigarettes   Smokeless tobacco: Never  Substance Use Topics   Alcohol use: Not Currently   Drug use: Not Currently     Allergies  Allergen Reactions   Escitalopram     Other Reaction(s): felt funny   Fluoxetine     Other Reaction(s): felt funny   Varenicline     Other Reaction(s): depression, insomnia    Review of Systems NEGATIVE UNLESS OTHERWISE INDICATED IN HPI      Objective:     BP 104/66   Pulse 72   Temp 97.8 F (36.6 C) (Temporal)   Ht 5' 2.25" (1.581 m)   Wt 103 lb (46.7 kg)   SpO2 98%   BMI 18.69 kg/m   Wt Readings from Last 3 Encounters:  11/17/23 103 lb (46.7 kg)  05/19/23 102 lb (46.3 kg)  11/11/22 107 lb 9.6 oz (48.8 kg)    BP Readings from Last 3 Encounters:  11/17/23 104/66  05/19/23 100/60  11/11/22 94/62     Physical Exam Vitals and nursing note reviewed.  Constitutional:      Appearance: Normal appearance. She is normal weight. She is not toxic-appearing.  HENT:     Head: Normocephalic and atraumatic.     Nose: Nose normal.     Mouth/Throat:     Mouth: Mucous membranes are moist.  Eyes:     Extraocular Movements: Extraocular movements intact.     Conjunctiva/sclera: Conjunctivae normal.     Pupils: Pupils are equal, round, and reactive to light.  Cardiovascular:     Rate and Rhythm: Normal rate and regular rhythm.     Heart sounds: Murmur (RUSB previously known murmur) heard.  Pulmonary:     Effort: Pulmonary effort is normal.     Breath sounds: Normal breath sounds.  Abdominal:     General: Abdomen is flat. Bowel sounds are normal.     Palpations: Abdomen is soft.  Musculoskeletal:        General: Normal range of motion.     Cervical back: Normal range of motion and neck supple.  Skin:    General: Skin is warm and dry.     Findings: Lesion (left lower leg see photo) present.  Neurological:     General: No focal deficit present.     Mental Status: She is alert and  oriented to person, place, and time.  Psychiatric:        Mood and Affect: Mood normal.        Behavior: Behavior normal.        Thought Content: Thought content normal.        Judgment: Judgment normal.             Crislyn Willbanks M Katrese Shell, PA-C

## 2023-11-17 NOTE — Patient Instructions (Signed)
 Call the [facility] for appointment:  Address: [address], [place], [place] [address] Phone: [phone]

## 2023-12-22 ENCOUNTER — Other Ambulatory Visit: Payer: 59

## 2023-12-26 ENCOUNTER — Encounter (HOSPITAL_BASED_OUTPATIENT_CLINIC_OR_DEPARTMENT_OTHER): Payer: Self-pay | Admitting: Physician Assistant

## 2024-02-14 ENCOUNTER — Encounter: Payer: Self-pay | Admitting: Physician Assistant

## 2024-02-14 ENCOUNTER — Ambulatory Visit: Admitting: Physician Assistant

## 2024-02-14 VITALS — BP 94/57 | HR 75 | Temp 98.1°F | Ht 62.5 in | Wt 102.0 lb

## 2024-02-14 DIAGNOSIS — F32A Depression, unspecified: Secondary | ICD-10-CM | POA: Diagnosis not present

## 2024-02-14 DIAGNOSIS — F411 Generalized anxiety disorder: Secondary | ICD-10-CM

## 2024-02-14 DIAGNOSIS — F419 Anxiety disorder, unspecified: Secondary | ICD-10-CM

## 2024-02-14 MED ORDER — MIRTAZAPINE 7.5 MG PO TABS: 7.5000 mg | ORAL_TABLET | Freq: Every day | ORAL | 0 refills | Status: DC

## 2024-02-14 NOTE — Progress Notes (Unsigned)
    Patient ID: Ashley [name], female    DOB: 1961-04-19, 63 y.o.   MRN: [id number]   Assessment & Plan:  GAD (generalized anxiety disorder)      Assessment and Plan Assessment & Plan       No follow-ups on file.    Subjective:    Chief Complaint  Patient presents with  . Medication Problem    Want to discuss the strength of xanax  and trazadone; and want to test hormones, been feeling really down and sad for the past 4-34months    HPI Discussed the use of AI scribe software for clinical note transcription with the patient, who gave verbal consent to proceed.  History of Present Illness      Past Medical History:  Diagnosis Date  . Coma (HCC)    medically induced  . COVID-19     History reviewed. No pertinent surgical history.  Family History  Problem Relation Age of Onset  . Dementia Mother   . Osteoporosis Mother   . Lung cancer Father     Social History   Tobacco Use  . Smoking status: Former    Types: Cigarettes  . Smokeless tobacco: Never  Substance Use Topics  . Alcohol use: Not Currently  . Drug use: Not Currently     Allergies  Allergen Reactions  . Escitalopram     Other Reaction(s): felt funny  . Fluoxetine     Other Reaction(s): felt funny  . Varenicline     Other Reaction(s): depression, insomnia    Review of Systems NEGATIVE UNLESS OTHERWISE INDICATED IN HPI      Objective:     BP (!) 94/57   Pulse 75   Temp 98.1 F (36.7 C)   Ht 5' 2.5 (1.588 m)   Wt 102 lb (46.3 kg)   SpO2 98%   BMI 18.36 kg/m   Wt Readings from Last 3 Encounters:  02/14/24 102 lb (46.3 kg)  11/17/23 103 lb (46.7 kg)  05/19/23 102 lb (46.3 kg)    BP Readings from Last 3 Encounters:  02/14/24 (!) 94/57  11/17/23 104/66  05/19/23 100/60     Physical Exam          Yamari Ventola M Olon Russ, PA-C

## 2024-02-14 NOTE — Patient Instructions (Signed)
  VISIT SUMMARY: During your visit, we discussed your ongoing anxiety, depression, sleep disturbances, and poor appetite. We have decided to make some changes to your medication to better manage these issues and improve your overall well-being.  YOUR PLAN: GENERALIZED ANXIETY DISORDER: You have been experiencing persistent anxiety and tension, and your current medication, Xanax , is not adequately managing your symptoms. -Continue taking your current dosage of Xanax . -We discussed the possibility of switching to mirtazapine to help manage your anxiety, improve sleep, and increase appetite.  MAJOR DEPRESSIVE DISORDER: You have been feeling down and sad most days, with occasional thoughts of self-harm. Previous antidepressants caused suicidal thoughts. -Switch from trazodone  to mirtazapine to help with depression and sleep issues. -Start mirtazapine at a low dose and monitor your response. -Reduce trazodone  to 50 mg while starting mirtazapine. -We will evaluate the effectiveness of mirtazapine in two weeks.  INSOMNIA: You have difficulty falling asleep and staying asleep, and you wake up earlier than desired. Your current treatment with trazodone  is not effective. -Switch from trazodone  to mirtazapine to improve sleep quality. -Reduce trazodone  to 50 mg while starting mirtazapine. -We will evaluate the effectiveness of mirtazapine in two weeks.  POOR APPETITE: You have a low appetite and have experienced weight loss. -Switch from trazodone  to mirtazapine to help improve your appetite. -We will evaluate the effectiveness of mirtazapine in two weeks.  GENERAL HEALTH MAINTENANCE: You are not currently exercising and have lost weight. Walking is encouraged to improve your overall health and well-being. -Try to incorporate walking into your routine as a form of exercise.  FOLLOW-UP: We need to assess the effectiveness of your new medication regimen and make any necessary adjustments. -Schedule a  follow-up appointment in two weeks. -Monitor for any adverse effects or lack of improvement.                      Contains text generated by [employer]bridge.                                 Contains text generated by [employer].

## 2024-02-20 ENCOUNTER — Ambulatory Visit: Payer: Self-pay

## 2024-02-20 NOTE — Telephone Encounter (Signed)
Please further advise.

## 2024-02-20 NOTE — Telephone Encounter (Signed)
 Copied from CRM [id number]. Topic: Clinical - Medical Advice >> Feb 20, 2024  9:26 AM Ashley [name] wrote: Reason for CRM: Patient is calling because she would like advice on what to do with her medication. She stated she's been taking the mirtazapine and it makes her feel bad. She doesn't want to keep taking anti depressants and would like to know what to do

## 2024-02-20 NOTE — Telephone Encounter (Addendum)
 FYI Only or Action Required?: Action required by provider: clinical question for provider.  Patient was last seen in primary care on 06/18  Called Nurse Triage reporting Medication Problem. Symptoms began yesterday. Interventions attempted: Prescription medications: See note . Symptoms are: stable.  Triage Disposition: Call PCP Within 24 Hours Unable to schedule with PCP, d/t lack of availability. Pt prefers PCP only. See note   Patient/caregiver understands and will follow disposition?: Yes        Answer Assessment - Initial Assessment Questions 1. NAME of MEDICINE: What medicine(s) are you calling about?     ---- Mirtazapine     2. QUESTION: What is your question? (e.g., double dose of medicine, side effect)     ----- Wants to know if she can come off this medication? She doesn't like the side effects she is experiencing.   3. PRESCRIBER: Who prescribed the medicine? Reason: if prescribed by specialist, call should be referred to that group.     ---------- PCP  4. SYMPTOMS: Do you have any symptoms?      ---- Dizziness, racing thoughts and heart racing ( onset yesterday)      Additional Information:  Patient stopped taking the medication abruptly and doesn't want to take the medication moving forward.  Patient wants to try different methods moving forward.  Patient educated on dangers of stopping certain meds abruptly. CAL line called, PCP will attempt to call the patient today about next steps.   Patient educated on pertinent s/s that would warrant emergent help/call 911/ ED/UC.  Patient verbalized understanding and agrees with plan No additional questions/concerns noted during the time of the call.  Protocols used: Medication Question Call-A-[facility]

## 2024-02-20 NOTE — Telephone Encounter (Signed)
 Called patient and checked in and ensured patient had behavioral health urgent care info, pt verbalized understanding of all information.

## 2024-02-28 ENCOUNTER — Ambulatory Visit: Admitting: Physician Assistant

## 2024-02-28 ENCOUNTER — Encounter: Payer: Self-pay | Admitting: Physician Assistant

## 2024-02-28 ENCOUNTER — Other Ambulatory Visit: Payer: Self-pay | Admitting: Physician Assistant

## 2024-02-28 VITALS — BP 94/54 | HR 70 | Temp 97.9°F | Ht 62.5 in | Wt 105.0 lb

## 2024-02-28 DIAGNOSIS — G4700 Insomnia, unspecified: Secondary | ICD-10-CM

## 2024-02-28 DIAGNOSIS — R7301 Impaired fasting glucose: Secondary | ICD-10-CM | POA: Insufficient documentation

## 2024-02-28 DIAGNOSIS — F419 Anxiety disorder, unspecified: Secondary | ICD-10-CM

## 2024-02-28 DIAGNOSIS — F339 Major depressive disorder, recurrent, unspecified: Secondary | ICD-10-CM | POA: Insufficient documentation

## 2024-02-28 DIAGNOSIS — Z860101 Personal history of adenomatous and serrated colon polyps: Secondary | ICD-10-CM | POA: Insufficient documentation

## 2024-02-28 DIAGNOSIS — F32A Depression, unspecified: Secondary | ICD-10-CM | POA: Diagnosis not present

## 2024-02-28 DIAGNOSIS — E785 Hyperlipidemia, unspecified: Secondary | ICD-10-CM | POA: Insufficient documentation

## 2024-02-28 DIAGNOSIS — J449 Chronic obstructive pulmonary disease, unspecified: Secondary | ICD-10-CM | POA: Insufficient documentation

## 2024-02-28 DIAGNOSIS — F329 Major depressive disorder, single episode, unspecified: Secondary | ICD-10-CM | POA: Insufficient documentation

## 2024-02-28 MED ORDER — RAMELTEON 8 MG PO TABS
8.0000 mg | ORAL_TABLET | Freq: Every day | ORAL | 0 refills | Status: AC
Start: 2024-02-28 — End: ?

## 2024-02-28 NOTE — Progress Notes (Signed)
 Patient ID: Ashley [name], female    DOB: [birth year], 63 y.o.   MRN: [id number]   Assessment & Plan:  Anxiety and depression -     Ramelteon; Take 1 tablet (8 mg total) by mouth at bedtime.  Dispense: 30 tablet; Refill: 0 -     Ambulatory referral to Psychiatry  Insomnia, unspecified type -     Ramelteon; Take 1 tablet (8 mg total) by mouth at bedtime.  Dispense: 30 tablet; Refill: 0 -     Ambulatory referral to Psychiatry      Assessment and Plan Assessment & Plan Anxiety She experiences heightened anxiety, particularly in the evenings, affecting her ability to relax and sleep. She currently takes alprazolam  twice daily but feels the need to increase the dosage due to increased anxiety. Concerns about long-term benzodiazepine use include dependency and potential dementia risk, especially given her family history of dementia. Cognitive behavioral therapy (CBT) was discussed as a beneficial approach to managing anxiety. She engages in activities like walking and socializing, which benefit her mental health. Intensive outpatient therapy or psychiatric consultation for further management was suggested. She is interested in [employer] and life coaching. Behavioral health urgent care is available for immediate support. - Continue alprazolam  twice daily, with caution regarding dependency and dementia risk. - Refer to Dr. Vincente, a psychiatrist in [place], for further management of anxiety and medication review. - Encourage consideration of cognitive behavioral therapy (CBT) to manage anxiety. - Provide information on behavioral health urgent care for immediate support.  Insomnia She has difficulty returning to sleep after waking at night, contributing to anxiety. She wakes at 3:15 AM and cannot return to sleep, leading to worry and restlessness. Ramelteon, a melatonin receptor agonist, was discussed as a sleep aid without benzodiazepine risks. She is interested in trying  Ramelteon as an alternative to trazodone , which she uses with mirtazapine  but finds ineffective for maintaining sleep. - Prescribe Ramelteon at bedtime for sleep initiation. - Continue trazodone , with the option to use it with Ramelteon if needed for sleep.  Family history of dementia She is concerned about her dementia risk due to family history. Her mother had dementia and died at 94, and her maternal uncle had Alzheimer's disease. She worries about genetic predisposition, especially with long-term benzodiazepine use, which is associated with increased dementia risk. Genetic testing for dementia risk assessment was discussed. - Discuss genetic testing for dementia risk assessment.  General Health Maintenance She is improving her health by engaging in physical activity and social interactions. She walks almost a mile and visits places like the drugstore and grocery store. She considers attending social events but feels overwhelmed by socializing. These activities are encouraged for mental health and well-being. - Encourage continued physical activity, such as walking, to improve mental health and well-being. - Encourage participation in social activities to reduce isolation and improve mental health.   F/up as scheduled      Subjective:    Chief Complaint  Patient presents with   Medical Management of Chronic Issues    Pt in office to discuss recent issues with new medication she attempted to start and after taking a few days had reaction and stopped taking immediately; pt is wanting to increase Xanax  to taking 3 times daily, also reports Trazodone  not working as it used to. Pt shows overdue for Mammogram and given contact info to get this scheduled.     HPI Discussed the use of AI scribe software for clinical note transcription with the  patient, who gave verbal consent to proceed.  History of Present Illness Ashley [name] is a 63 year old female with anxiety who presents with  increased anxiety and sleep disturbances.  She has experienced a significant increase in anxiety after discontinuing a medication that previously caused suicidal thoughts. Her anxiety is described as 'off the chart' with tenseness, particularly when sitting or watching TV. She practices deep breathing to manage these symptoms but often does not catch herself in time.  She is currently taking alprazolam , typically two times a day, but sometimes takes a quarter of a dose at noon when her anxiety peaks. She is concerned about the long-term use of benzodiazepines and the associated risk of dementia, especially given her family history of dementia and Alzheimer's disease.  She experiences sleep disturbances, waking up around 3:15 to 3:30 AM and being unable to return to sleep, which leads to worrisome thoughts. She has tried trazodone  in combination with mirtazapine , which initially worked well, but she still struggles with sleep.  Her daily activities are limited, and she feels trapped by her anxiety. She babysits her grandchildren once a week, which she finds beneficial, but she lacks motivation for other activities. She has started walking almost a mile and is making an effort to go out more often to combat feelings of isolation.  She is exploring options for support, including a Dance movement psychotherapist and life coach from her church, and is interested in a Christian-based approach to managing her anxiety.     Past Medical History:  Diagnosis Date   Coma (HCC)    medically induced   COVID-19     History reviewed. No pertinent surgical history.  Family History  Problem Relation Age of Onset   Dementia Mother    Osteoporosis Mother    Lung cancer Father    Alzheimer's disease Maternal Uncle     Social History   Tobacco Use   Smoking status: Former    Types: Cigarettes   Smokeless tobacco: Never  Substance Use Topics   Alcohol use: Not Currently   Drug use: Not Currently     Allergies  Allergen  Reactions   Escitalopram     Other Reaction(s): felt funny   Fluoxetine     Other Reaction(s): felt funny   Varenicline     Other Reaction(s): depression, insomnia    Review of Systems NEGATIVE UNLESS OTHERWISE INDICATED IN HPI      Objective:     BP (!) 94/54 (BP Location: Left Arm, Patient Position: Sitting, Cuff Size: Normal)   Pulse 70   Temp 97.9 F (36.6 C) (Temporal)   Ht 5' 2.5 (1.588 m)   Wt 105 lb (47.6 kg)   SpO2 99%   BMI 18.90 kg/m   Wt Readings from Last 3 Encounters:  02/28/24 105 lb (47.6 kg)  02/14/24 102 lb (46.3 kg)  11/17/23 103 lb (46.7 kg)    BP Readings from Last 3 Encounters:  02/28/24 (!) 94/54  02/14/24 (!) 94/57  11/17/23 104/66     Physical Exam Vitals and nursing note reviewed.  Constitutional:      Appearance: Normal appearance. She is normal weight. She is not toxic-appearing.  HENT:     Head: Normocephalic and atraumatic.  Eyes:     Extraocular Movements: Extraocular movements intact.     Conjunctiva/sclera: Conjunctivae normal.     Pupils: Pupils are equal, round, and reactive to light.  Cardiovascular:     Rate and Rhythm: Normal rate and regular rhythm.  Musculoskeletal:        General: Normal range of motion.     Cervical back: Normal range of motion and neck supple.  Skin:    General: Skin is warm and dry.     Findings: No lesion.  Neurological:     General: No focal deficit present.     Mental Status: She is alert and oriented to person, place, and time.  Psychiatric:     Comments: Very tearful, anxious             Levita Monical M Keedan Sample, PA-C

## 2024-02-28 NOTE — Patient Instructions (Addendum)
 If you develop suicidal thoughts, please tell someone and immediately proceed to our local 24/7 crisis center, [facility].[address], [place], [place] [phone].   VISIT SUMMARY: During your visit, we discussed your increased anxiety and sleep disturbances. We reviewed your current medications and concerns about long-term benzodiazepine use. We also explored options for managing your anxiety and improving your sleep, including new medications and therapy options.  YOUR PLAN: ANXIETY: You are experiencing heightened anxiety, especially in the evenings, which affects your ability to relax and sleep. You are currently taking alprazolam  twice daily but are concerned about long-term use. -Continue taking alprazolam  twice daily, but be cautious about dependency and dementia risk. -We will refer you to Dr. Vincente, a psychiatrist in [place], for further management of your anxiety and a review of your medications. -Consider cognitive behavioral therapy (CBT) to help manage your anxiety. -We provided information on behavioral health urgent care for immediate support.  INSOMNIA: You have difficulty returning to sleep after waking up at night, which contributes to your anxiety. -We will prescribe Ramelteon to take at bedtime to help you fall asleep. -Continue taking trazodone , and you can use it with Ramelteon if needed for sleep.  FAMILY HISTORY OF DEMENTIA: You are concerned about your risk of dementia due to your family history and the potential risks associated with long-term benzodiazepine use. -We discussed the option of genetic testing to assess your risk of dementia.  GENERAL HEALTH MAINTENANCE: You are making efforts to improve your health by engaging in physical activity and social interactions. -Continue your physical activities, such as walking, to improve your mental health and well-being. -Participate in  social activities to reduce isolation and improve your mental health.                      Contains text generated by [employer].                                 Contains text generated by [employer].

## 2024-02-29 ENCOUNTER — Telehealth: Payer: Self-pay

## 2024-02-29 ENCOUNTER — Other Ambulatory Visit (HOSPITAL_COMMUNITY): Payer: Self-pay

## 2024-02-29 NOTE — Telephone Encounter (Signed)
 Do we have a status or update on this PA for patient

## 2024-02-29 NOTE — Telephone Encounter (Signed)
 Noted

## 2024-02-29 NOTE — Telephone Encounter (Signed)
 Copied from CRM [id number]. Topic: Clinical - Medication Question >> Feb 29, 2024  8:31 AM [place] E wrote: Reason for CRM: Patient called in stating that she got a text from [facility] stating that her ramelteon (ROZEREM) 8 MG tablet needs an updated prescription form.  Patient also noticed on her visit summary from yesterday that it was noted that she has been taking her Trazodone  differently, but patient wanted to relay that she is following directions for that medication and has not taken it any differently.  Please see pt msg and call note. Msg sent to pharmacy team regarding PA for medication. Per appt yesterday Trazodone  is being taken daily per patient not daily PRN.

## 2024-02-29 NOTE — Telephone Encounter (Signed)
 Previous msg sent through Rx refills from PA team

## 2024-02-29 NOTE — Telephone Encounter (Signed)
 Pharmacy Patient Advocate Encounter   Received notification from RX Request Messages that prior authorization for Ramelteon 8MG  tabs is required/requested.   Insurance verification completed.   The patient is insured through [employer] .   Per test claim: Step therapy required, alternative drug therapy required prior to use of Ramelteon tabs. Must try at least one of the following; Zolpidem, Zolpidem ER, zaleplon or eszopiclone.

## 2024-03-04 NOTE — Telephone Encounter (Signed)
 A letter was mailed to the patient on 03/04/2024 regarding the psychiatry referral. They are in the process of getting her scheduled

## 2024-03-12 ENCOUNTER — Other Ambulatory Visit: Payer: Self-pay | Admitting: Physician Assistant

## 2024-03-12 ENCOUNTER — Other Ambulatory Visit: Payer: Self-pay

## 2024-05-23 ENCOUNTER — Encounter: Admitting: Physician Assistant
